# Patient Record
Sex: Female | Born: 1945 | Race: White | Hispanic: No | Smoking: Never smoker
Health system: Southern US, Community
[De-identification: ages and names within clinical notes are randomized; demographics above are authoritative.]

## PROBLEM LIST (undated history)

## (undated) DIAGNOSIS — M1990 Unspecified osteoarthritis, unspecified site: Secondary | ICD-10-CM

## (undated) DIAGNOSIS — T4145XA Adverse effect of unspecified anesthetic, initial encounter: Secondary | ICD-10-CM

## (undated) DIAGNOSIS — K219 Gastro-esophageal reflux disease without esophagitis: Secondary | ICD-10-CM

## (undated) DIAGNOSIS — R51 Headache: Secondary | ICD-10-CM

## (undated) DIAGNOSIS — H269 Unspecified cataract: Secondary | ICD-10-CM

## (undated) DIAGNOSIS — I10 Essential (primary) hypertension: Secondary | ICD-10-CM

## (undated) DIAGNOSIS — T8859XA Other complications of anesthesia, initial encounter: Secondary | ICD-10-CM

## (undated) DIAGNOSIS — Z8719 Personal history of other diseases of the digestive system: Secondary | ICD-10-CM

## (undated) DIAGNOSIS — I639 Cerebral infarction, unspecified: Secondary | ICD-10-CM

## (undated) DIAGNOSIS — Z8489 Family history of other specified conditions: Secondary | ICD-10-CM

## (undated) DIAGNOSIS — F419 Anxiety disorder, unspecified: Secondary | ICD-10-CM

## (undated) DIAGNOSIS — E785 Hyperlipidemia, unspecified: Secondary | ICD-10-CM

## (undated) HISTORY — PX: EYE SURGERY: SHX253

## (undated) HISTORY — PX: TONSILLECTOMY: SUR1361

## (undated) HISTORY — DX: Hyperlipidemia, unspecified: E78.5

---
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
## 1999-12-10 ENCOUNTER — Encounter: Admission: RE | Admit: 1999-12-10 | Discharge: 1999-12-10 | Payer: Self-pay | Admitting: Family Medicine

## 1999-12-10 ENCOUNTER — Encounter: Payer: Self-pay | Admitting: Family Medicine

## 2000-12-11 ENCOUNTER — Encounter: Admission: RE | Admit: 2000-12-11 | Discharge: 2000-12-11 | Payer: Self-pay | Admitting: Family Medicine

## 2000-12-11 ENCOUNTER — Encounter: Payer: Self-pay | Admitting: Family Medicine

## 2001-10-07 ENCOUNTER — Emergency Department (HOSPITAL_COMMUNITY): Admission: EM | Admit: 2001-10-07 | Discharge: 2001-10-07 | Payer: Self-pay | Admitting: Emergency Medicine

## 2001-10-07 ENCOUNTER — Encounter: Payer: Self-pay | Admitting: Emergency Medicine

## 2001-12-13 ENCOUNTER — Encounter: Admission: RE | Admit: 2001-12-13 | Discharge: 2001-12-13 | Payer: Self-pay | Admitting: Family Medicine

## 2001-12-13 ENCOUNTER — Encounter: Payer: Self-pay | Admitting: Family Medicine

## 2003-01-10 ENCOUNTER — Encounter: Payer: Self-pay | Admitting: Family Medicine

## 2003-01-10 ENCOUNTER — Encounter: Admission: RE | Admit: 2003-01-10 | Discharge: 2003-01-10 | Payer: Self-pay | Admitting: Family Medicine

## 2004-02-04 ENCOUNTER — Encounter: Admission: RE | Admit: 2004-02-04 | Discharge: 2004-02-04 | Payer: Self-pay | Admitting: Family Medicine

## 2005-02-23 ENCOUNTER — Encounter: Admission: RE | Admit: 2005-02-23 | Discharge: 2005-02-23 | Payer: Self-pay | Admitting: Family Medicine

## 2006-02-24 ENCOUNTER — Encounter: Admission: RE | Admit: 2006-02-24 | Discharge: 2006-02-24 | Payer: Self-pay | Admitting: Family Medicine

## 2007-02-28 ENCOUNTER — Encounter: Admission: RE | Admit: 2007-02-28 | Discharge: 2007-02-28 | Payer: Self-pay | Admitting: Family Medicine

## 2007-07-20 ENCOUNTER — Encounter: Admission: RE | Admit: 2007-07-20 | Discharge: 2007-07-20 | Payer: Self-pay | Admitting: Family Medicine

## 2011-09-09 ENCOUNTER — Other Ambulatory Visit: Payer: Self-pay | Admitting: Family Medicine

## 2011-09-14 ENCOUNTER — Ambulatory Visit
Admission: RE | Admit: 2011-09-14 | Discharge: 2011-09-14 | Disposition: A | Discharge status: Home / Self Care | Payer: No Typology Code available for payment source | Source: Ambulatory Visit | Attending: Family Medicine | Admitting: Family Medicine

## 2011-09-16 ENCOUNTER — Ambulatory Visit (HOSPITAL_COMMUNITY)
Admission: RE | Admit: 2011-09-16 | Discharge: 2011-09-16 | Disposition: A | Discharge status: Hospice | Payer: Self-pay | Source: Ambulatory Visit | Attending: Gastroenterology | Admitting: Gastroenterology

## 2011-09-16 ENCOUNTER — Encounter (HOSPITAL_COMMUNITY): Payer: Self-pay | Admitting: *Deleted

## 2011-09-16 ENCOUNTER — Encounter (HOSPITAL_COMMUNITY)
Admission: RE | Disposition: A | Discharge status: Hospice | Payer: Self-pay | Source: Ambulatory Visit | Attending: Gastroenterology

## 2011-09-16 DIAGNOSIS — D126 Benign neoplasm of colon, unspecified: Secondary | ICD-10-CM | POA: Insufficient documentation

## 2011-09-16 DIAGNOSIS — K449 Diaphragmatic hernia without obstruction or gangrene: Secondary | ICD-10-CM | POA: Insufficient documentation

## 2011-09-16 DIAGNOSIS — R112 Nausea with vomiting, unspecified: Secondary | ICD-10-CM | POA: Insufficient documentation

## 2011-09-16 DIAGNOSIS — K5730 Diverticulosis of large intestine without perforation or abscess without bleeding: Secondary | ICD-10-CM | POA: Insufficient documentation

## 2011-09-16 DIAGNOSIS — R634 Abnormal weight loss: Secondary | ICD-10-CM | POA: Insufficient documentation

## 2011-09-16 DIAGNOSIS — K921 Melena: Secondary | ICD-10-CM | POA: Insufficient documentation

## 2011-09-16 DIAGNOSIS — R109 Unspecified abdominal pain: Secondary | ICD-10-CM | POA: Insufficient documentation

## 2011-09-16 HISTORY — PX: ESOPHAGOGASTRODUODENOSCOPY: SHX5428

## 2011-09-16 HISTORY — DX: Unspecified osteoarthritis, unspecified site: M19.90

## 2011-09-16 HISTORY — DX: Anxiety disorder, unspecified: F41.9

## 2011-09-16 HISTORY — PX: COLONOSCOPY: SHX5424

## 2011-09-16 SURGERY — EGD (ESOPHAGOGASTRODUODENOSCOPY)
Anesthesia: Moderate Sedation

## 2011-09-16 MED ORDER — FENTANYL CITRATE 0.05 MG/ML IJ SOLN
INTRAMUSCULAR | Status: AC
  Filled 2011-09-16: qty 4

## 2011-09-16 MED ORDER — SODIUM CHLORIDE 0.9 % IV SOLN
Freq: Once | INTRAVENOUS | Status: AC
  Administered 2011-09-16: 500 mL via INTRAVENOUS

## 2011-09-16 MED ORDER — DIPHENHYDRAMINE HCL 50 MG/ML IJ SOLN
INTRAMUSCULAR | Status: AC
  Filled 2011-09-16: qty 1

## 2011-09-16 MED ORDER — MIDAZOLAM HCL 10 MG/2ML IJ SOLN
INTRAMUSCULAR | Status: AC
  Filled 2011-09-16: qty 4

## 2011-09-16 MED ORDER — SODIUM CHLORIDE 0.9 % IV SOLN: INTRAVENOUS | Status: DC

## 2011-09-16 MED ORDER — FENTANYL NICU IV SYRINGE 50 MCG/ML
INJECTION | INTRAMUSCULAR | Status: DC | PRN
  Administered 2011-09-16 (×4): 25 ug via INTRAVENOUS

## 2011-09-16 MED ORDER — MIDAZOLAM HCL 10 MG/2ML IJ SOLN
INTRAMUSCULAR | Status: DC | PRN
  Administered 2011-09-16: 2 mg via INTRAVENOUS
  Administered 2011-09-16: 1 mg via INTRAVENOUS
  Administered 2011-09-16 (×3): 2 mg via INTRAVENOUS

## 2011-09-16 NOTE — H&P (Signed)
  Reason for Consult: ABM pain, Melena Referring Physician: Marjory Lies, M.D.  Tilman Neat HPI: This is a 66 year old female with ABM pain and melena.  Her symptoms started 3 weeks ago and it is also associated with a 14 lbs weight loss.  Nausea and vomiting are symptoms and in the office she was identified to have an HGB of 9.  Past Medical History  Diagnosis Date  . Arthritis   . Anxiety     History reviewed. No pertinent past surgical history.  History reviewed. No pertinent family history.  Social History:  does not have a smoking history on file. She does not have any smokeless tobacco history on file. She reports that she does not drink alcohol or use illicit drugs.  Allergies:  Allergies  Allergen Reactions  . Sulfa Drugs Cross Reactors Nausea And Vomiting    Medications:  Scheduled:   . sodium chloride   Intravenous Once   Continuous:   No results found for this or any previous visit (from the past 24 hour(s)).   No results found.  ROS:  As stated above in the HPI otherwise negative.  Blood pressure 134/77, pulse 68, temperature 98.3 F (36.8 C), resp. rate 15, height 5\' 2"  (1.575 m), weight 58.968 kg (130 lb), SpO2 100.00%.    PE: Gen: NAD, Alert and Oriented HEENT:  [place]/AT, EOMI Neck: Supple, no LAD Lungs: CTA Bilaterally CV: RRR without M/G/R ABM: Soft, diffuse ABM pain, +BS Ext: No C/C/E  Assessment/Plan: 1) Heme positive stool. 2) ABM pain. 3) Melena.  Plan: 1) EGD/Colonoscopy.  , D 09/16/2011, 1:15 PM

## 2011-09-16 NOTE — Op Note (Signed)
Eligha Bridegroom [facility] [address] [place], [place]  [address]  OPERATIVE PROCEDURE REPORT  PATIENT:  Brittany Osborn, Brittany Osborn  MR#:  [id number] BIRTHDATE:  07-Sep-1945  GENDER:  female ENDOSCOPIST:  Jeani Hawking, MD ASSISTANT:  Kizzie Bane and Claudie Revering, RN CGRN PROCEDURE DATE:  09/16/2011 PROCEDURE:  Colonoscopy with snare polypectomy ASA CLASS:  Class II INDICATIONS:  1) Abdominal pain  2) heme positive stool MEDICATIONS:  Fentanyl 25 mcg IV, Versed 4 mg IV  DESCRIPTION OF PROCEDURE:   After the risks benefits and alternatives of the procedure were thoroughly explained, informed consent was obtained.  Digital rectal exam was performed and revealed no abnormalities.   The Pentax Colonoscope [id number] and [id number] [id number]) endoscope was introduced through the anus and advanced to the terminal ileum which was intubated for a short distance, without limitations.  The quality of the prep was excellent..  The instrument was then slowly withdrawn as the colon was fully examined. <<PROCEDUREIMAGES>>  FINDINGS:  A 3 mm sessile sigmoid colon polyp was removed with a cold snare. A couple of sigmoid diverticula were identified. No other abnormalities noted. The colon was difficult to traverse as her sigmoid colon was torturous and redundant.   Retroflexed views in the rectum revealed internal and external hemorrhoids.    The scope was then withdrawn from the patient and the procedure terminated.  COMPLICATIONS:  None  IMPRESSION:  1) Sessile polyp 2) Internal and external hemorrhoids 3) Diverticula RECOMMENDATIONS:  1) Await biopsy results 2) Repeat colonoscopy in 5-10 years.  ______________________________ Jeani Hawking, MD  n. Rosalie DoctorJeani Hawking at 09/16/2011 01:59 PM  Nehemiah Massed, [phone]

## 2011-09-16 NOTE — Discharge Instructions (Addendum)
Colonoscopy Care After Read the instructions outlined below and refer to this sheet in the next few weeks. These discharge instructions provide you with general information on caring for yourself after you leave the hospital. Your doctor may also give you specific instructions. While your treatment has been planned according to the most current medical practices available, unavoidable complications occasionally occur. If you have any problems or questions after discharge, call your doctor. HOME CARE INSTRUCTIONS ACTIVITY:  You may resume your regular activity, but move at a slower pace for the next 24 hours.   Take frequent rest periods for the next 24 hours.   Walking will help get rid of the air and reduce the bloated feeling in your belly (abdomen).   No driving for 24 hours (because of the medicine (anesthesia) used during the test).   You may shower.   Do not sign any important legal documents or operate any machinery for 24 hours (because of the anesthesia used during the test).  NUTRITION:  Drink plenty of fluids.   You may resume your normal diet as instructed by your doctor.   Begin with a light meal and progress to your normal diet. Heavy or fried foods are harder to digest and may make you feel sick to your stomach (nauseated).   Avoid alcoholic beverages for 24 hours or as instructed.  MEDICATIONS:  You may resume your normal medications unless your doctor tells you otherwise.  WHAT TO EXPECT TODAY:  Some feelings of bloating in the abdomen.   Passage of more gas than usual.   Spotting of blood in your stool or on the toilet paper.  IF YOU HAD POLYPS REMOVED DURING THE COLONOSCOPY:  No aspirin products for 7 days or as instructed.   No alcohol for 7 days or as instructed.   Eat a soft diet for the next 24 hours.  FINDING OUT THE RESULTS OF YOUR TEST Not all test results are available during your visit. If your test results are not back during the visit, make an  appointment with your caregiver to find out the results. Do not assume everything is normal if you have not heard from your caregiver or the medical facility. It is important for you to follow up on all of your test results.  SEEK IMMEDIATE MEDICAL CARE IF:  You have more than a spotting of blood in your stool.   Your belly is swollen (abdominal distention).   You are nauseated or vomiting.   You have a fever.   You have abdominal pain or discomfort that is severe or gets worse throughout the day.  Document Released: 12/29/2003 Document Revised: 05/05/2011 Document Reviewed: 12/27/2007 [facility] Patient Information 2012 [place], [place].Endoscopy Care After Please read the instructions outlined below and refer to this sheet in the next few weeks. These discharge instructions provide you with general information on caring for yourself after you leave the hospital. Your doctor may also give you specific instructions. While your treatment has been planned according to the most current medical practices available, unavoidable complications occasionally occur. If you have any problems or questions after discharge, please call your doctor. HOME CARE INSTRUCTIONS Activity You may resume your regular activity but move at a slower pace for the next 24 hours.  Take frequent rest periods for the next 24 hours.  Walking will help expel (get rid of) the air and reduce the bloated feeling in your abdomen.  No driving for 24 hours (because of the anesthesia (medicine) used during  the test).  You may shower.  Do not sign any important legal documents or operate any machinery for 24 hours (because of the anesthesia used during the test).  Nutrition Drink plenty of fluids.  You may resume your normal diet.  Begin with a light meal and progress to your normal diet.  Avoid alcoholic beverages for 24 hours or as instructed by your caregiver.  Medications You may resume your normal medications unless your  caregiver tells you otherwise. What you can expect today You may experience abdominal discomfort such as a feeling of fullness or "gas" pains.  You may experience a sore throat for 2 to 3 days. This is normal. Gargling with salt water may help this.  Follow-up Your doctor will discuss the results of your test with you. SEEK IMMEDIATE MEDICAL CARE IF: You have excessive nausea (feeling sick to your stomach) and/or vomiting.  You have severe abdominal pain and distention (swelling).  You have trouble swallowing.  You have a temperature over 100 F (37.8 C).  You have rectal bleeding or vomiting of blood.  Document Released: 12/29/2003 Document Revised: 05/05/2011 Document Reviewed: 07/11/2007 [facility] Patient Information 2012 [place], [place]. Endoscopy Care After Please read the instructions outlined below and refer to this sheet in the next few weeks. These discharge instructions provide you with general information on caring for yourself after you leave the hospital. Your doctor may also give you specific instructions. While your treatment has been planned according to the most current medical practices available, unavoidable complications occasionally occur. If you have any problems or questions after discharge, please call your doctor. HOME CARE INSTRUCTIONS Activity  You may resume your regular activity but move at a slower pace for the next 24 hours.   Take frequent rest periods for the next 24 hours.   Walking will help expel (get rid of) the air and reduce the bloated feeling in your abdomen.   No driving for 24 hours (because of the anesthesia (medicine) used during the test).   You may shower.   Do not sign any important legal documents or operate any machinery for 24 hours (because of the anesthesia used during the test).  Nutrition  Drink plenty of fluids.   You may resume your normal diet.   Begin with a light meal and progress to your normal diet.   Avoid alcoholic  beverages for 24 hours or as instructed by your caregiver.  Medications You may resume your normal medications unless your caregiver tells you otherwise. What you can expect today  You may experience abdominal discomfort such as a feeling of fullness or "gas" pains.   You may experience a sore throat for 2 to 3 days. This is normal. Gargling with salt water may help this.  Follow-up Your doctor will discuss the results of your test with you. SEEK IMMEDIATE MEDICAL CARE IF:  You have excessive nausea (feeling sick to your stomach) and/or vomiting.   You have severe abdominal pain and distention (swelling).   You have trouble swallowing.   You have a temperature over 100 F (37.8 C).   You have rectal bleeding or vomiting of blood.  Document Released: 12/29/2003 Document Revised: 05/05/2011 Document Reviewed: 07/11/2007 [facility] Patient Information 2012 [place], [place].Colonoscopy Care After Read the instructions outlined below and refer to this sheet in the next few weeks. These discharge instructions provide you with general information on caring for yourself after you leave the hospital. Your doctor may also give you specific instructions. While your  treatment has been planned according to the most current medical practices available, unavoidable complications occasionally occur. If you have any problems or questions after discharge, call your doctor. HOME CARE INSTRUCTIONS ACTIVITY:  You may resume your regular activity, but move at a slower pace for the next 24 hours.   Take frequent rest periods for the next 24 hours.   Walking will help get rid of the air and reduce the bloated feeling in your belly (abdomen).   No driving for 24 hours (because of the medicine (anesthesia) used during the test).   You may shower.   Do not sign any important legal documents or operate any machinery for 24 hours (because of the anesthesia used during the test).  NUTRITION:  Drink plenty  of fluids.   You may resume your normal diet as instructed by your doctor.   Begin with a light meal and progress to your normal diet. Heavy or fried foods are harder to digest and may make you feel sick to your stomach (nauseated).   Avoid alcoholic beverages for 24 hours or as instructed.  MEDICATIONS:  You may resume your normal medications unless your doctor tells you otherwise.  WHAT TO EXPECT TODAY:  Some feelings of bloating in the abdomen.   Passage of more gas than usual.   Spotting of blood in your stool or on the toilet paper.  IF YOU HAD POLYPS REMOVED DURING THE COLONOSCOPY:  No aspirin products for 7 days or as instructed.   No alcohol for 7 days or as instructed.   Eat a soft diet for the next 24 hours.  FINDING OUT THE RESULTS OF YOUR TEST Not all test results are available during your visit. If your test results are not back during the visit, make an appointment with your caregiver to find out the results. Do not assume everything is normal if you have not heard from your caregiver or the medical facility. It is important for you to follow up on all of your test results.  SEEK IMMEDIATE MEDICAL CARE IF:  You have more than a spotting of blood in your stool.   Your belly is swollen (abdominal distention).   You are nauseated or vomiting.   You have a fever.   You have abdominal pain or discomfort that is severe or gets worse throughout the day.  Document Released: 12/29/2003 Document Revised: 05/05/2011 Document Reviewed: 12/27/2007 [facility] Patient Information 2012 [place], [place].Endoscopy Care After Please read the instructions outlined below and refer to this sheet in the next few weeks. These discharge instructions provide you with general information on caring for yourself after you leave the hospital. Your doctor may also give you specific instructions. While your treatment has been planned according to the most current medical practices available,  unavoidable complications occasionally occur. If you have any problems or questions after discharge, please call your doctor. HOME CARE INSTRUCTIONS Activity You may resume your regular activity but move at a slower pace for the next 24 hours.  Take frequent rest periods for the next 24 hours.  Walking will help expel (get rid of) the air and reduce the bloated feeling in your abdomen.  No driving for 24 hours (because of the anesthesia (medicine) used during the test).  You may shower.  Do not sign any important legal documents or operate any machinery for 24 hours (because of the anesthesia used during the test).  Nutrition Drink plenty of fluids.  You may resume your normal diet.  Begin  with a light meal and progress to your normal diet.  Avoid alcoholic beverages for 24 hours or as instructed by your caregiver.  Medications You may resume your normal medications unless your caregiver tells you otherwise. What you can expect today You may experience abdominal discomfort such as a feeling of fullness or "gas" pains.  You may experience a sore throat for 2 to 3 days. This is normal. Gargling with salt water may help this.  Follow-up Your doctor will discuss the results of your test with you. SEEK IMMEDIATE MEDICAL CARE IF: You have excessive nausea (feeling sick to your stomach) and/or vomiting.  You have severe abdominal pain and distention (swelling).  You have trouble swallowing.  You have a temperature over 100 F (37.8 C).  You have rectal bleeding or vomiting of blood.  Document Released: 12/29/2003 Document Revised: 05/05/2011 Document Reviewed: 07/11/2007 [facility] Patient Information 2012 [place], [place].

## 2011-09-16 NOTE — Op Note (Signed)
Eligha Bridegroom [facility] [address] [place], [place]  [address]  OPERATIVE PROCEDURE REPORT  PATIENT:  Charlize, Hathaway  MR#:  [id number] BIRTHDATE:  Jun 10, 1945  GENDER:  female ENDOSCOPIST:  Jeani Hawking, MD ASSISTANT:  Kizzie Bane and Claudie Revering, RN CGRN PROCEDURE DATE:  09/16/2011 PROCEDURE:  EGD, diagnostic [phone] ASA CLASS:  Class II INDICATIONS:  Nausea/Vomiting and ABM pain MEDICATIONS:  Fentanyl 50 mcg IV, Versed 4 mg IV  DESCRIPTION OF PROCEDURE:   After the risks benefits and alternatives of the procedure were thoroughly explained, informed consent was obtained.  The Pentax Gastroscope [id number] endoscope was introduced through the mouth and advanced to the second portion of the duodenum, without limitations.  The instrument was slowly withdrawn as the mucosa was fully examined. <<PROCEDUREIMAGES>>  FINDINGS:  An LA Grade D distal esophagitis was identified in the setting of a large 8 cm hiatal hernia. Within the hernia sac, a large periesophageal hernia was identified. No evidence of any vascular compromise. No other abnormalities noted in the upper GI tract, i.e., ulcerations, erosions, or vascular abnormalities. Retroflexion was not performed.  The scope was then withdrawn from the patient and the procedure terminated.  COMPLICATIONS:  None  IMPRESSION:  1) LA Grade Dsophagitis. 2) 8 cm hiatal hernia. 3) Paraesophageal hiatal hernia. RECOMMENDATIONS:  1) Continue with Nexium. 2) CT scan of the ABM/Pelvis. 3) Surgical consultation if symptoms do not resolve with Nexium.   ______________________________ Jeani Hawking, MD  n. Rosalie DoctorJeani Hawking at 09/16/2011 02:04 PM  Nehemiah Massed, [phone]

## 2011-09-19 ENCOUNTER — Encounter (HOSPITAL_COMMUNITY): Payer: Self-pay | Admitting: Gastroenterology

## 2012-09-19 ENCOUNTER — Telehealth (INDEPENDENT_AMBULATORY_CARE_PROVIDER_SITE_OTHER): Payer: Self-pay

## 2012-09-19 NOTE — Telephone Encounter (Signed)
Patient states she is doing ok at this time with her hiatal hernia. Her insurance starts  may 1,2014. Appointment with Dr. Ezzard Standing was made for 10/04/12 @ 4p. Advised her to go to the ER.if symptoms worsens. Patient verbalizes understanding.

## 2012-10-04 ENCOUNTER — Other Ambulatory Visit (INDEPENDENT_AMBULATORY_CARE_PROVIDER_SITE_OTHER): Payer: Self-pay

## 2012-10-04 ENCOUNTER — Encounter (INDEPENDENT_AMBULATORY_CARE_PROVIDER_SITE_OTHER): Payer: Self-pay | Admitting: Surgery

## 2012-10-04 ENCOUNTER — Ambulatory Visit (INDEPENDENT_AMBULATORY_CARE_PROVIDER_SITE_OTHER): Payer: Medicare Other | Admitting: Surgery

## 2012-10-04 VITALS — BP 134/72 | HR 60 | Resp 14 | Ht 62.0 in | Wt 128.0 lb

## 2012-10-04 DIAGNOSIS — R109 Unspecified abdominal pain: Secondary | ICD-10-CM

## 2012-10-04 NOTE — Progress Notes (Addendum)
Re:   Brittany Osborn DOB:   03/06/1946 MRN:   [id number]  ASSESSMENT AND PLAN: 1.  Hiatal Hernia, approx 8 cm on upper endo.  Grade 4 esophagitis.  I have an upper endo by Dr. Elnoria Howard from April 2013.  I have no other radiologic tests.  I discussed the work up and management of a hiatal hernia and GERD.  She is dong a lot of things right.  We'll start with an UGI.  Depending on the findings, I talked about a CT scan.  Manometry most likely will not be necessary.  I gave her literature on medical and surgical management of GERD/HH.    I'll talk to her on the phone after the UGI and we'll decide on the next step.  [Spoke with Dr. Elnoria Howard.  He had not seen the patient in a year, but did reinforce that she had significant esophagitis. Dr. Jena Gauss spoke to me about the UGI and we discussed the size of the [facility] and how her stomach outlet was partially blocked. I spoke to the patient.  There is no reason for further diagnostic tests.  I think that she needs a hiatal hernia repair with probable Nissen.  Will schedule this.  I'll try to see her back in the office before surgery.  DN  10/10/2012]  1a.  Has LUQ/left flank pain   She's unsure whether it is due to the vomiting (musculoskeletal) or GI in nature.  2.  Persistent nausea and vomiting - almost daily x 2 years.  She says she has lost about 50 pounds since this all began, but she is holding her weight stable over the last 3 months.  3.  Arthritis - hands and legs 4.  Anxiety 5.  Bronchitis since she was a child  Coughs and tight chest, but since she has gotten off milk, she has gotten better.  No chief complaint on file.  REFERRING PHYSICIAN:   Dr. Chip Boer  HISTORY OF PRESENT ILLNESS: Brittany Osborn is a 67 y.o. (DOB: 04/02/1946)  white  female whose primary care physician is BURNETT,BRENT A, MD and comes to me today for hiatal hernia.  She dates her symptoms as going on for 2 years. She complained of almost daily vomiting. She would vomit 5-10  minutes after eating. She occasionally vomits at night, but not usually. She stopped taking milk and dairy products, and is taking almond milk and this has helped some. She has no burning when she eats her food, but she had burning when she vomits.  She has noticed no blood in her vomit.  She is eating a bland diet, staying away from fried and spicy food.  She is cooking with olive oil, which seems to help.   Dramamine seems to help her nausea and Benadryl seems to help her sleep.   She is on Ranitidine and Zegerid, but I'm a little unclear how much these are helping her.  She's had some trouble with constipation - metamucil sometimes helps.  Ms. Helman underwent an upper endo by Dr. Elnoria Howard on 09/16/2011 - he noted an 8 cm HH and grade 4 esophagitis.  She also underwent a colonoscopy 09/16/2011 which showed some diverticulosis.  He found a hyperplastic polyp.  She had a [place] on 09/09/2011 that was negative.  On a CXR in 2009, I can see what I think is the suggestion of a HH.  I do not have a report in Epic.  Part of the issues of her getting  care has been that she had no insurance.  She now has medicare.  She has no history of liver, gallbladder or GYN problems. She's had no prior abdominal surgery other than a C-section for her son.    Past Medical History  Diagnosis Date  . Arthritis   . Anxiety       Past Surgical History  Procedure Laterality Date  . Esophagogastroduodenoscopy  09/16/2011    Procedure: ESOPHAGOGASTRODUODENOSCOPY (EGD);  Surgeon: Theda Belfast, MD;  Location: [facility] ENDOSCOPY;  Service: Endoscopy;  Laterality: N/A;  Per Dr. Elnoria Howard, pt wants do procedures at [facility] instead of [facility]  . Colonoscopy  09/16/2011    Procedure: COLONOSCOPY;  Surgeon: Theda Belfast, MD;  Location: [facility] ENDOSCOPY;  Service: Endoscopy;  Laterality: N/A;      Current Outpatient Prescriptions  Medication Sig Dispense Refill  . DimenhyDRINATE (DRAMAMINE PO) Take by mouth.      Maxwell Caul Bicarbonate (ZEGERID  PO) Take by mouth.      . polyethylene glycol powder (GLYCOLAX/MIRALAX) powder Take 17 g by mouth once.      . ranitidine (ZANTAC) 150 MG capsule Take 150 mg by mouth 2 (two) times daily.       No current facility-administered medications for this visit.      Allergies  Allergen Reactions  . Sulfa Drugs Cross Reactors Nausea And Vomiting    REVIEW OF SYSTEMS: Skin:  No history of rash.  No history of abnormal moles. Infection:  No history of hepatitis or HIV.  No history of MRSA. Neurologic:  No history of stroke.  No history of seizure.  No history of headaches. Cardiac:  No history of hypertension. No history of heart disease.    No history of seeing a cardiologist. Pulmonary:  Bronchitis since a child.  Endocrine:  No diabetes. No thyroid disease. Gastrointestinal:  See HPI.  No history of liver disease.  No history of gall bladder disease.  No history of pancreas disease.  No history of colon disease. Urologic:  No history of kidney stones.  No history of bladder infections. Musculoskeletal: Osteoarthritis of hands and legs. Hematologic:  No bleeding disorder.  No history of anemia.  Not anticoagulated. Psycho-social:  The patient is oriented.   The patient has no obvious psychologic or social impairment to understanding our conversation and plan.  SOCIAL and FAMILY HISTORY: Married.  Husband Brittany Osborn  (DOB - 10/09/1939) (I did a bilateral inguinal hernia on him in 01/2011) Son also in room.  They also have a daguhter.  PHYSICAL EXAM: BP 134/72  Pulse 60  Resp 14  Ht 5\' 2"  (1.575 m)  Wt 128 lb (58.06 kg)  BMI 23.41 kg/m2  General: WN thin WF who is alert and generally healthy appearing.  HEENT: Normal. Pupils equal. Neck: Supple. No mass.  No thyroid mass. Lymph Nodes:  No supraclavicular or cervical nodes. Lungs: Clear to auscultation and symmetric breath sounds. Heart:  RRR. No murmur or rub.  Abdomen: Soft. No mass. No tenderness. No hernia. Normal bowel sounds.   Scar from C section.  Particular attention paid to her left flank, where I can not feel much. Rectal: Not done. Extremities:  Good strength and ROM  in upper and lower extremities. Neurologic:  Grossly intact to motor and sensory function. Psychiatric: Has normal mood and affect. Behavior is normal.   DATA REVIEWED: Epic and Dr. Haywood Pao notes.  Ovidio Kin, MD,  [facility] [address] [place]  [phone]  [place], [place]    [place] Phone:  [phone] FAX:  [phone]

## 2012-10-05 ENCOUNTER — Telehealth (INDEPENDENT_AMBULATORY_CARE_PROVIDER_SITE_OTHER): Payer: Self-pay

## 2012-10-05 NOTE — Telephone Encounter (Signed)
UGI scheduled for 10/09/12@ 11:00am at [facility] ; NPO after midnight. Patient aware

## 2012-10-09 ENCOUNTER — Ambulatory Visit (HOSPITAL_COMMUNITY)
Admission: RE | Admit: 2012-10-09 | Discharge: 2012-10-09 | Disposition: A | Discharge status: Home / Self Care | Payer: Medicare Other | Source: Ambulatory Visit | Attending: Surgery | Admitting: Surgery

## 2012-10-09 DIAGNOSIS — K219 Gastro-esophageal reflux disease without esophagitis: Secondary | ICD-10-CM | POA: Insufficient documentation

## 2012-10-09 DIAGNOSIS — R109 Unspecified abdominal pain: Secondary | ICD-10-CM

## 2012-10-09 DIAGNOSIS — R1110 Vomiting, unspecified: Secondary | ICD-10-CM | POA: Insufficient documentation

## 2012-10-09 DIAGNOSIS — K449 Diaphragmatic hernia without obstruction or gangrene: Secondary | ICD-10-CM | POA: Insufficient documentation

## 2012-10-10 ENCOUNTER — Other Ambulatory Visit (INDEPENDENT_AMBULATORY_CARE_PROVIDER_SITE_OTHER): Payer: Self-pay | Admitting: Surgery

## 2012-10-29 ENCOUNTER — Telehealth (INDEPENDENT_AMBULATORY_CARE_PROVIDER_SITE_OTHER): Payer: Self-pay

## 2012-10-29 NOTE — Telephone Encounter (Signed)
Informed patient DR. Ezzard Standing will be seeing her next week I will call to inform her of date and time  Per Dr. Ezzard Standing.  Dr. Ezzard Standing is L DOW June 9-13

## 2012-10-30 ENCOUNTER — Encounter (HOSPITAL_COMMUNITY): Payer: Self-pay | Admitting: Pharmacy Technician

## 2012-11-07 NOTE — Patient Instructions (Signed)
Brittany Osborn  11/07/2012   Your procedure is scheduled on:  11/12/12    Report to [facility] at   0815 AM.  Call this number if you have problems the morning of surgery: [phone]   Remember:   Do not eat food or drink liquids after midnight.   Take these medicines the morning of surgery with A SIP OF WATER:    Do not wear jewelry, make-up or nail polish.  Do not wear lotions, powders, or perfumes.   Do not shave 48 hours prior to surgery.   Do not bring valuables to the hospital.  Contacts, dentures or bridgework may not be worn into surgery.  Leave suitcase in the car. After surgery it may be brought to your room.  For patients admitted to the hospital, checkout time is 11:00 AM the day of  discharge.       SEE CHG INSTRUCTION SHEET    Please read over the following fact sheets that you were given: MRSA Information, coughing and deep breathing exercises, leg exercises               Failure to comply with these instructions may result in cancellation of your surgery.                Patient Signature ____________________________              Nurse Signature _____________________________

## 2012-11-08 ENCOUNTER — Other Ambulatory Visit (INDEPENDENT_AMBULATORY_CARE_PROVIDER_SITE_OTHER): Payer: Self-pay | Admitting: Surgery

## 2012-11-08 ENCOUNTER — Encounter (HOSPITAL_COMMUNITY): Payer: Self-pay

## 2012-11-08 ENCOUNTER — Encounter (HOSPITAL_COMMUNITY)
Admission: RE | Admit: 2012-11-08 | Discharge: 2012-11-08 | Disposition: A | Discharge status: Home / Self Care | Payer: Medicare Other | Source: Ambulatory Visit | Attending: Surgery | Admitting: Surgery

## 2012-11-08 DIAGNOSIS — Z01812 Encounter for preprocedural laboratory examination: Secondary | ICD-10-CM | POA: Insufficient documentation

## 2012-11-08 DIAGNOSIS — K209 Esophagitis, unspecified without bleeding: Secondary | ICD-10-CM | POA: Insufficient documentation

## 2012-11-08 DIAGNOSIS — K449 Diaphragmatic hernia without obstruction or gangrene: Secondary | ICD-10-CM | POA: Insufficient documentation

## 2012-11-08 HISTORY — DX: Unspecified cataract: H26.9

## 2012-11-08 HISTORY — DX: Headache: R51

## 2012-11-08 HISTORY — DX: Gastro-esophageal reflux disease without esophagitis: K21.9

## 2012-11-08 HISTORY — DX: Personal history of other diseases of the digestive system: Z87.19

## 2012-11-08 LAB — SURGICAL PCR SCREEN: MRSA, PCR: NEGATIVE

## 2012-11-08 LAB — BASIC METABOLIC PANEL
CO2: 27 mEq/L (ref 19–32)
Calcium: 9.3 mg/dL (ref 8.4–10.5)
Creatinine, Ser: 0.65 mg/dL (ref 0.50–1.10)
GFR calc Af Amer: >90 mL/min (ref >90)
Sodium: 138 mEq/L (ref 135–145)

## 2012-11-08 LAB — CBC
MCH: 21.8 pg — ABNORMAL LOW (ref 26.0–34.0)
MCV: 74.6 fL — ABNORMAL LOW (ref 78.0–100.0)
Platelets: 280 10*3/uL (ref 150–400)
RBC: 4.22 MIL/uL (ref 3.87–5.11)
RDW: 17.2 % — ABNORMAL HIGH (ref 11.5–15.5)
WBC: 2.4 10*3/uL — ABNORMAL LOW (ref 4.0–10.5)

## 2012-11-08 NOTE — Progress Notes (Signed)
Reported CBC result to Dr Lucianne Muss office.  The nurse will notify  MD.

## 2012-11-08 NOTE — Progress Notes (Signed)
CBC faxed via EPIC to Dr Raelyn Mora.

## 2012-11-12 ENCOUNTER — Inpatient Hospital Stay (HOSPITAL_COMMUNITY)
Admission: RE | Admit: 2012-11-12 | Discharge: 2012-11-14 | DRG: 328 | Disposition: A | Discharge status: Home / Self Care | Payer: Medicare Other | Source: Ambulatory Visit | Attending: Surgery | Admitting: Surgery

## 2012-11-12 ENCOUNTER — Encounter (HOSPITAL_COMMUNITY): Payer: Self-pay | Admitting: Anesthesiology

## 2012-11-12 ENCOUNTER — Encounter (HOSPITAL_COMMUNITY): Payer: Self-pay | Admitting: *Deleted

## 2012-11-12 ENCOUNTER — Inpatient Hospital Stay (HOSPITAL_COMMUNITY): Payer: Medicare Other | Admitting: Anesthesiology

## 2012-11-12 ENCOUNTER — Encounter (HOSPITAL_COMMUNITY)
Admission: RE | Disposition: A | Discharge status: Home / Self Care | Payer: Self-pay | Source: Ambulatory Visit | Attending: Surgery

## 2012-11-12 DIAGNOSIS — J40 Bronchitis, not specified as acute or chronic: Secondary | ICD-10-CM | POA: Diagnosis present

## 2012-11-12 DIAGNOSIS — K449 Diaphragmatic hernia without obstruction or gangrene: Secondary | ICD-10-CM

## 2012-11-12 DIAGNOSIS — Z8719 Personal history of other diseases of the digestive system: Secondary | ICD-10-CM

## 2012-11-12 DIAGNOSIS — Z79899 Other long term (current) drug therapy: Secondary | ICD-10-CM

## 2012-11-12 DIAGNOSIS — F411 Generalized anxiety disorder: Secondary | ICD-10-CM | POA: Diagnosis present

## 2012-11-12 DIAGNOSIS — K209 Esophagitis, unspecified without bleeding: Secondary | ICD-10-CM | POA: Diagnosis present

## 2012-11-12 DIAGNOSIS — K219 Gastro-esophageal reflux disease without esophagitis: Secondary | ICD-10-CM | POA: Diagnosis present

## 2012-11-12 HISTORY — PX: LAPAROSCOPIC NISSEN FUNDOPLICATION: SHX1932

## 2012-11-12 SURGERY — FUNDOPLICATION, NISSEN, LAPAROSCOPIC
Anesthesia: General | Site: Abdomen | Wound class: Clean Contaminated

## 2012-11-12 MED ORDER — BIOTENE DRY MOUTH MT LIQD
15.0000 mL | Freq: Two times a day (BID) | OROMUCOSAL | Status: DC
  Administered 2012-11-13: 15 mL via OROMUCOSAL

## 2012-11-12 MED ORDER — LACTATED RINGERS IV SOLN: INTRAVENOUS | Status: DC

## 2012-11-12 MED ORDER — SUCCINYLCHOLINE CHLORIDE 20 MG/ML IJ SOLN
INTRAMUSCULAR | Status: DC | PRN
  Administered 2012-11-12: 100 mg via INTRAVENOUS

## 2012-11-12 MED ORDER — LACTATED RINGERS IV SOLN
INTRAVENOUS | Status: DC
  Administered 2012-11-12: 1000 mL via INTRAVENOUS
  Administered 2012-11-12: 11:00:00 via INTRAVENOUS

## 2012-11-12 MED ORDER — POTASSIUM CHLORIDE IN NACL 20-0.45 MEQ/L-% IV SOLN
INTRAVENOUS | Status: DC
  Administered 2012-11-12 – 2012-11-14 (×5): via INTRAVENOUS
  Filled 2012-11-12 (×6): qty 1000

## 2012-11-12 MED ORDER — ONDANSETRON HCL 4 MG PO TABS: 4.0000 mg | ORAL_TABLET | Freq: Four times a day (QID) | ORAL | Status: DC | PRN

## 2012-11-12 MED ORDER — CHLORHEXIDINE GLUCONATE 0.12 % MT SOLN
15.0000 mL | Freq: Two times a day (BID) | OROMUCOSAL | Status: DC
  Administered 2012-11-13: 15 mL via OROMUCOSAL
  Filled 2012-11-12 (×3): qty 15

## 2012-11-12 MED ORDER — PROMETHAZINE HCL 25 MG/ML IJ SOLN: 6.2500 mg | INTRAMUSCULAR | Status: DC | PRN

## 2012-11-12 MED ORDER — HEPARIN SODIUM (PORCINE) 5000 UNIT/ML IJ SOLN
5000.0000 [IU] | Freq: Three times a day (TID) | INTRAMUSCULAR | Status: DC
  Filled 2012-11-12 (×2): qty 1

## 2012-11-12 MED ORDER — PROPOFOL 10 MG/ML IV BOLUS
INTRAVENOUS | Status: DC | PRN
  Administered 2012-11-12: 80 ug via INTRAVENOUS
  Administered 2012-11-12: 120 ug via INTRAVENOUS

## 2012-11-12 MED ORDER — EPHEDRINE SULFATE 50 MG/ML IJ SOLN
INTRAMUSCULAR | Status: DC | PRN
  Administered 2012-11-12: 5 mg via INTRAVENOUS

## 2012-11-12 MED ORDER — MIDAZOLAM HCL 5 MG/5ML IJ SOLN
INTRAMUSCULAR | Status: DC | PRN
  Administered 2012-11-12: 2 mg via INTRAVENOUS

## 2012-11-12 MED ORDER — GLYCOPYRROLATE 0.2 MG/ML IJ SOLN
INTRAMUSCULAR | Status: DC | PRN
  Administered 2012-11-12: .4 mg via INTRAVENOUS

## 2012-11-12 MED ORDER — MORPHINE SULFATE 2 MG/ML IJ SOLN
INTRAMUSCULAR | Status: AC
  Administered 2012-11-12: 1 mg via INTRAVENOUS
  Filled 2012-11-12: qty 1

## 2012-11-12 MED ORDER — MORPHINE SULFATE 2 MG/ML IJ SOLN
INTRAMUSCULAR | Status: AC
  Administered 2012-11-12: 2 mg via INTRAVENOUS
  Filled 2012-11-12: qty 1

## 2012-11-12 MED ORDER — PROMETHAZINE HCL 25 MG/ML IJ SOLN: 12.5000 mg | Freq: Four times a day (QID) | INTRAMUSCULAR | Status: DC | PRN

## 2012-11-12 MED ORDER — FENTANYL CITRATE 0.05 MG/ML IJ SOLN
INTRAMUSCULAR | Status: DC | PRN
  Administered 2012-11-12 (×2): 50 ug via INTRAVENOUS
  Administered 2012-11-12: 100 ug via INTRAVENOUS

## 2012-11-12 MED ORDER — ONDANSETRON HCL 4 MG/2ML IJ SOLN: 4.0000 mg | Freq: Four times a day (QID) | INTRAMUSCULAR | Status: DC | PRN

## 2012-11-12 MED ORDER — LIDOCAINE HCL 1 % IJ SOLN
INTRAMUSCULAR | Status: DC | PRN
  Administered 2012-11-12: 80 mg via INTRADERMAL

## 2012-11-12 MED ORDER — MEPERIDINE HCL 50 MG/ML IJ SOLN: 6.2500 mg | INTRAMUSCULAR | Status: DC | PRN

## 2012-11-12 MED ORDER — CHLORHEXIDINE GLUCONATE 4 % EX LIQD: 1.0000 "application " | Freq: Once | CUTANEOUS | Status: DC

## 2012-11-12 MED ORDER — CISATRACURIUM BESYLATE (PF) 10 MG/5ML IV SOLN
INTRAVENOUS | Status: DC | PRN
  Administered 2012-11-12: 6 mg via INTRAVENOUS
  Administered 2012-11-12: 4 mg via INTRAVENOUS

## 2012-11-12 MED ORDER — HEPARIN SODIUM (PORCINE) 5000 UNIT/ML IJ SOLN
5000.0000 [IU] | Freq: Three times a day (TID) | INTRAMUSCULAR | Status: DC
  Administered 2012-11-12 – 2012-11-14 (×5): 5000 [IU] via SUBCUTANEOUS
  Filled 2012-11-12 (×8): qty 1

## 2012-11-12 MED ORDER — LACTATED RINGERS IV SOLN
INTRAVENOUS | Status: DC | PRN
  Administered 2012-11-12: 1000 mL via INTRAVENOUS

## 2012-11-12 MED ORDER — NEOSTIGMINE METHYLSULFATE 1 MG/ML IJ SOLN
INTRAMUSCULAR | Status: DC | PRN
  Administered 2012-11-12: 4 mg via INTRAVENOUS

## 2012-11-12 MED ORDER — ONDANSETRON HCL 4 MG/2ML IJ SOLN
INTRAMUSCULAR | Status: DC | PRN
  Administered 2012-11-12: 4 mg via INTRAVENOUS

## 2012-11-12 MED ORDER — PANTOPRAZOLE SODIUM 40 MG IV SOLR
40.0000 mg | Freq: Two times a day (BID) | INTRAVENOUS | Status: DC
  Administered 2012-11-12 – 2012-11-13 (×3): 40 mg via INTRAVENOUS
  Filled 2012-11-12 (×5): qty 40

## 2012-11-12 MED ORDER — MORPHINE SULFATE 10 MG/ML IJ SOLN
1.0000 mg | INTRAMUSCULAR | Status: DC | PRN
  Administered 2012-11-12: 1 mg via INTRAVENOUS
  Filled 2012-11-12: qty 1

## 2012-11-12 MED ORDER — BUPIVACAINE HCL (PF) 0.25 % IJ SOLN
INTRAMUSCULAR | Status: DC | PRN
  Administered 2012-11-12: 29 mL

## 2012-11-12 MED ORDER — FENTANYL CITRATE 0.05 MG/ML IJ SOLN
25.0000 ug | INTRAMUSCULAR | Status: DC | PRN
  Administered 2012-11-12 (×2): 25 ug via INTRAVENOUS
  Administered 2012-11-12: 50 ug via INTRAVENOUS

## 2012-11-12 MED ORDER — CEFAZOLIN SODIUM-DEXTROSE 2-3 GM-% IV SOLR
2.0000 g | INTRAVENOUS | Status: AC
  Administered 2012-11-12: 2 g via INTRAVENOUS

## 2012-11-12 SURGICAL SUPPLY — 51 items
APPLIER CLIP ROT 10 11.4 M/L (STAPLE) ×2
BENZOIN TINCTURE PRP APPL 2/3 (GAUZE/BANDAGES/DRESSINGS) IMPLANT
CANISTER SUCTION 2500CC (MISCELLANEOUS) IMPLANT
CANNULA ENDOPATH XCEL 11M (ENDOMECHANICALS) IMPLANT
CLAMP ENDO BABCK 10MM (STAPLE) IMPLANT
CLIP APPLIE ROT 10 11.4 M/L (STAPLE) ×1 IMPLANT
CLOTH BEACON ORANGE TIMEOUT ST (SAFETY) ×2 IMPLANT
DECANTER SPIKE VIAL GLASS SM (MISCELLANEOUS) ×2 IMPLANT
DERMABOND ADVANCED (GAUZE/BANDAGES/DRESSINGS) ×1
DERMABOND ADVANCED .7 DNX12 (GAUZE/BANDAGES/DRESSINGS) ×1 IMPLANT
DEVICE SUT QUICK LOAD TK 5 (STAPLE) ×14 IMPLANT
DEVICE SUT TI-KNOT TK 5X26 (MISCELLANEOUS) ×2 IMPLANT
DEVICE SUTURE ENDOST 10MM (ENDOMECHANICALS) ×2 IMPLANT
DISSECTOR BLUNT TIP ENDO 5MM (MISCELLANEOUS) IMPLANT
DRAIN PENROSE 18X1/2 LTX STRL (DRAIN) ×2 IMPLANT
DRAPE LAPAROSCOPIC ABDOMINAL (DRAPES) ×2 IMPLANT
ELECT REM PT RETURN 9FT ADLT (ELECTROSURGICAL) ×2
ELECTRODE REM PT RTRN 9FT ADLT (ELECTROSURGICAL) ×1 IMPLANT
FELT TEFLON 4 X1 (Mesh General) ×2 IMPLANT
GLOVE BIOGEL PI IND STRL 7.0 (GLOVE) ×1 IMPLANT
GLOVE BIOGEL PI INDICATOR 7.0 (GLOVE) ×1
GLOVE SURG SIGNA 7.5 PF LTX (GLOVE) ×2 IMPLANT
GOWN STRL NON-REIN LRG LVL3 (GOWN DISPOSABLE) IMPLANT
GOWN STRL REIN XL XLG (GOWN DISPOSABLE) ×12 IMPLANT
GRASPER ENDO BABCOCK 10 (MISCELLANEOUS) IMPLANT
GRASPER ENDO BABCOCK 10MM (MISCELLANEOUS)
KIT BASIN OR (CUSTOM PROCEDURE TRAY) ×2 IMPLANT
NS IRRIG 1000ML POUR BTL (IV SOLUTION) ×2 IMPLANT
PENCIL BUTTON HOLSTER BLD 10FT (ELECTRODE) IMPLANT
RETRACTOR LAPSCP 12X46 CVD (ENDOMECHANICALS) IMPLANT
RTRCTR LAPSCP 12X46 CVD (ENDOMECHANICALS)
SCALPEL HARMONIC ACE (MISCELLANEOUS) ×2 IMPLANT
SET IRRIG TUBING LAPAROSCOPIC (IRRIGATION / IRRIGATOR) ×2 IMPLANT
SLEEVE XCEL OPT CAN 5 100 (ENDOMECHANICALS) ×6 IMPLANT
SOLUTION ANTI FOG 6CC (MISCELLANEOUS) ×2 IMPLANT
STAPLER VISISTAT 35W (STAPLE) IMPLANT
STRIP CLOSURE SKIN 1/2X4 (GAUZE/BANDAGES/DRESSINGS) IMPLANT
SUT SURGIDAC NAB ES-9 0 48 120 (SUTURE) ×14 IMPLANT
SUT VIC AB 4-0 SH 18 (SUTURE) IMPLANT
SUT VICRYL 0 TIES 12 18 (SUTURE) IMPLANT
TIP INNERVISION DETACH 40FR (MISCELLANEOUS) IMPLANT
TIP INNERVISION DETACH 50FR (MISCELLANEOUS) IMPLANT
TIP INNERVISION DETACH 56FR (MISCELLANEOUS) ×2 IMPLANT
TIPS INNERVISION DETACH 40FR (MISCELLANEOUS)
TOWEL OR 17X26 10 PK STRL BLUE (TOWEL DISPOSABLE) ×2 IMPLANT
TRAY FOLEY CATH 14FRSI W/METER (CATHETERS) ×2 IMPLANT
TRAY LAP CHOLE (CUSTOM PROCEDURE TRAY) ×2 IMPLANT
TROCAR BLADELESS OPT 5 75 (ENDOMECHANICALS) ×2 IMPLANT
TROCAR XCEL 12X100 BLDLESS (ENDOMECHANICALS) IMPLANT
TROCAR XCEL NON-BLD 11X100MML (ENDOMECHANICALS) ×2 IMPLANT
TUBING INSUFFLATION 10FT LAP (TUBING) ×2 IMPLANT

## 2012-11-12 NOTE — H&P (Signed)
Re: Brittany Osborn  DOB: Aug 16, 1945  MRN: [id number]   ASSESSMENT AND PLAN:  1. Hiatal Hernia, approx 8 cm on upper endo.   Grade 4 esophagitis.   I have an upper endo by Dr. Elnoria Howard from April 2013. I have no other radiologic tests.   I discussed the work up and management of a hiatal hernia and GERD. She is dong a lot of things right. We'll start with an UGI. Depending on the findings, I talked about a CT scan. Manometry most likely will not be necessary. I gave her literature on medical and surgical management of GERD/HH.   I'll talk to her on the phone after the UGI and we'll decide on the next step.  [Spoke with Dr. Elnoria Howard. He had not seen the patient in a year, but did reinforce that she had significant esophagitis.  Dr. Jena Gauss spoke to me about the UGI and we discussed the size of the [facility] and how her stomach outlet was partially blocked.  I spoke to the patient. There is no reason for further diagnostic tests. I think that she needs a hiatal hernia repair with probable Nissen. Will schedule this. I'll try to see her back in the office before surgery. DN 10/10/2012]   1a. Has LUQ/left flank pain   She's unsure whether it is due to the vomiting (musculoskeletal) or GI in nature.  2. Persistent nausea and vomiting - almost daily x 2 years.  She says she has lost about 50 pounds since this all began, but she is holding her weight stable over the last 3 months.  3. Arthritis - hands and legs  4. Anxiety  5. Bronchitis since she was a child   Coughs and tight chest, but since she has gotten off milk, she has gotten better.   No chief complaint on file.   REFERRING PHYSICIAN: Dr. Chip Boer   HISTORY OF PRESENT ILLNESS:  Brittany Osborn is a 67 y.o. (DOB: 1946-05-04) white female whose primary care physician is BURNETT,BRENT A, MD and comes to me today for hiatal hernia.  She dates her symptoms as going on for 2 years. She complained of almost daily vomiting. She would vomit 5-10 minutes after eating. She  occasionally vomits at night, but not usually. She stopped taking milk and dairy products, and is taking almond milk and this has helped some. She has no burning when she eats her food, but she had burning when she vomits. She has noticed no blood in her vomit. She is eating a bland diet, staying away from fried and spicy food. She is cooking with olive oil, which seems to help. Dramamine seems to help her nausea and Benadryl seems to help her sleep. She is on Ranitidine and Zegerid, but I'm a little unclear how much these are helping her. She's had some trouble with constipation - metamucil sometimes helps.   Ms. Thurlow underwent an upper endo by Dr. Elnoria Howard on 09/16/2011 - he noted an 8 cm HH and grade 4 esophagitis. She also underwent a colonoscopy 09/16/2011 which showed some diverticulosis. He found a hyperplastic polyp. She had a [place] on 09/09/2011 that was negative. On a CXR in 2009, I can see what I think is the suggestion of a HH. I do not have a report in Epic.  Part of the issues of her getting care has been that she had no insurance. She now has medicare.  She has no history of liver, gallbladder or GYN problems. She's had no  prior abdominal surgery other than a C-section for her son.   Past Medical History   Diagnosis  Date   .  Arthritis    .  Anxiety     Past Surgical History   Procedure  Laterality  Date   .  Esophagogastroduodenoscopy   09/16/2011     Procedure: ESOPHAGOGASTRODUODENOSCOPY (EGD); Surgeon: Theda Belfast, MD; Location: [facility] ENDOSCOPY; Service: Endoscopy; Laterality: N/A; Per Dr. Elnoria Howard, pt wants do procedures at [facility] instead of [facility]   .  Colonoscopy   09/16/2011     Procedure: COLONOSCOPY; Surgeon: Theda Belfast, MD; Location: [facility] ENDOSCOPY; Service: Endoscopy; Laterality: N/A;    Current Outpatient Prescriptions   Medication  Sig  Dispense  Refill   .  DimenhyDRINATE (DRAMAMINE PO)  Take by mouth.     Maxwell Caul Bicarbonate (ZEGERID PO)  Take by mouth.     .   polyethylene glycol powder (GLYCOLAX/MIRALAX) powder  Take 17 g by mouth once.     .  ranitidine (ZANTAC) 150 MG capsule  Take 150 mg by mouth 2 (two) times daily.      No current facility-administered medications for this visit.    Allergies   Allergen  Reactions   .  Sulfa Drugs Cross Reactors  Nausea And Vomiting    REVIEW OF SYSTEMS:  Skin: No history of rash. No history of abnormal moles.  Infection: No history of hepatitis or HIV. No history of MRSA.  Neurologic: No history of stroke. No history of seizure. No history of headaches.  Cardiac: No history of hypertension. No history of heart disease. No history of seeing a cardiologist.  Pulmonary: Bronchitis since a child.  Endocrine: No diabetes. No thyroid disease.  Gastrointestinal: See HPI. No history of liver disease. No history of gall bladder disease. No history of pancreas disease. No history of colon disease.  Urologic: No history of kidney stones. No history of bladder infections.  Musculoskeletal: Osteoarthritis of hands and legs.  Hematologic: No bleeding disorder. No history of anemia. Not anticoagulated.  Psycho-social: The patient is oriented. The patient has no obvious psychologic or social impairment to understanding our conversation and plan.   SOCIAL and FAMILY HISTORY:  Married. Husband Marvalene Barrett (DOB - 10/09/1939) (I did a bilateral inguinal hernia on him in 01/2011)  Son also in room. They also have a daguhter.   PHYSICAL EXAM:  BP 124/53  Pulse 63  Temp(Src) 97.8 F (36.6 C) (Oral)  Resp 18  SpO2 100%  General: WN thin WF who is alert and generally healthy appearing.  HEENT: Normal. Pupils equal.  Neck: Supple. No mass. No thyroid mass.  Lymph Nodes: No supraclavicular or cervical nodes.  Lungs: Clear to auscultation and symmetric breath sounds.  Heart: RRR. No murmur or rub.  Abdomen: Soft. No mass. No tenderness. No hernia. Normal bowel sounds. Scar from C section. Particular attention paid to her  left flank, where I can not feel much.  Rectal: Not done.  Extremities: Good strength and ROM in upper and lower extremities.  Neurologic: Grossly intact to motor and sensory function.  Psychiatric: Has normal mood and affect. Behavior is normal.   DATA REVIEWED:  Epic and Dr. Haywood Pao notes.   Ovidio Kin, MD, [facility]  [address] [place]  [place], [place] [address]  Phone: [phone] FAX: [phone]

## 2012-11-12 NOTE — Anesthesia Procedure Notes (Signed)
Procedure Name: Intubation Date/Time: 11/12/2012 10:15 AM Performed by: Hulan Fess Pre-anesthesia Checklist: Patient identified, Emergency Drugs available, Suction available and Timeout performed Patient Re-evaluated:Patient Re-evaluated prior to inductionOxygen Delivery Method: Circle system utilized Preoxygenation: Pre-oxygenation with 100% oxygen Intubation Type: IV induction Laryngoscope Size: Mac and 3 Grade View: Grade I Tube type: Oral Tube size: 8.0 mm Number of attempts: 1 Placement Confirmation: ETT inserted through vocal cords under direct vision Secured at: 22 cm Tube secured with: Tape Dental Injury: Teeth and Oropharynx as per pre-operative assessment

## 2012-11-12 NOTE — Anesthesia Postprocedure Evaluation (Signed)
  Anesthesia Post-op Note  Patient: Brittany Osborn  Procedure(s) Performed: Procedure(s) (LRB): LAPAROSCOPIC NISSEN FUNDOPLICATION laparoscopic hiatal hernia repair   (N/A)  Patient Location: PACU  Anesthesia Type: General  Level of Consciousness: awake and alert   Airway and Oxygen Therapy: Patient Spontanous Breathing  Post-op Pain: mild  Post-op Assessment: Post-op Vital signs reviewed, Patient's Cardiovascular Status Stable, Respiratory Function Stable, Patent Airway and No signs of Nausea or vomiting  Last Vitals:  Filed Vitals:   11/12/12 1430  BP: 157/77  Pulse: 58  Temp:   Resp: 18    Post-op Vital Signs: stable   Complications: No apparent anesthesia complications

## 2012-11-12 NOTE — Transfer of Care (Signed)
Immediate Anesthesia Transfer of Care Note  Patient: Brittany Osborn  Procedure(s) Performed: Procedure(s): LAPAROSCOPIC NISSEN FUNDOPLICATION laparoscopic hiatal hernia repair   (N/A)  Patient Location: PACU  Anesthesia Type:General  Level of Consciousness: awake and alert   Airway & Oxygen Therapy: Patient Spontanous Breathing and Patient connected to face mask oxygen  Post-op Assessment: Report given to PACU RN  Post vital signs: Reviewed and stable  Complications: No apparent anesthesia complications

## 2012-11-12 NOTE — Anesthesia Preprocedure Evaluation (Signed)
Anesthesia Evaluation  Patient identified by MRN, date of birth, ID band Patient awake    Reviewed: Allergy & Precautions, H&P , NPO status , Patient's Chart, lab work & pertinent test results  Airway Mallampati: II TM Distance: >3 FB Neck ROM: Full    Dental no notable dental hx.    Pulmonary neg pulmonary ROS,  breath sounds clear to auscultation  Pulmonary exam normal       Cardiovascular negative cardio ROS  Rhythm:Regular Rate:Normal     Neuro/Psych negative neurological ROS  negative psych ROS   GI/Hepatic negative GI ROS, Neg liver ROS, hiatal hernia, GERD-  Poorly Controlled,  Endo/Other  negative endocrine ROS  Renal/GU negative Renal ROS  negative genitourinary   Musculoskeletal negative musculoskeletal ROS (+)   Abdominal   Peds negative pediatric ROS (+)  Hematology negative hematology ROS (+)   Anesthesia Other Findings   Reproductive/Obstetrics negative OB ROS                           Anesthesia Physical Anesthesia Plan  ASA: I  Anesthesia Plan: General   Post-op Pain Management:    Induction: Intravenous, Rapid sequence and Cricoid pressure planned  Airway Management Planned: Oral ETT  Additional Equipment:   Intra-op Plan:   Post-operative Plan: Extubation in OR  Informed Consent: I have reviewed the patients History and Physical, chart, labs and discussed the procedure including the risks, benefits and alternatives for the proposed anesthesia with the patient or authorized representative who has indicated his/her understanding and acceptance.   Dental advisory given  Plan Discussed with: CRNA  Anesthesia Plan Comments:         Anesthesia Quick Evaluation

## 2012-11-13 ENCOUNTER — Encounter (HOSPITAL_COMMUNITY): Payer: Self-pay | Admitting: Surgery

## 2012-11-13 ENCOUNTER — Inpatient Hospital Stay (HOSPITAL_COMMUNITY): Payer: Medicare Other

## 2012-11-13 MED ORDER — ADULT MULTIVITAMIN W/MINERALS CH
1.0000 | ORAL_TABLET | Freq: Every day | ORAL | Status: DC
  Administered 2012-11-13 – 2012-11-14 (×2): 1 via ORAL
  Filled 2012-11-13 (×2): qty 1

## 2012-11-13 MED ORDER — MORPHINE SULFATE 2 MG/ML IJ SOLN
INTRAMUSCULAR | Status: AC
  Administered 2012-11-13: 1 mg via INTRAVENOUS
  Filled 2012-11-13: qty 1

## 2012-11-13 MED ORDER — OXYCODONE-ACETAMINOPHEN 5-325 MG/5ML PO SOLN
5.0000 mL | ORAL | Status: DC | PRN
  Administered 2012-11-13: 5 mL via ORAL
  Filled 2012-11-13: qty 5

## 2012-11-13 MED ORDER — BOOST / RESOURCE BREEZE PO LIQD
1.0000 | Freq: Two times a day (BID) | ORAL | Status: DC
  Administered 2012-11-14: 1 via ORAL

## 2012-11-13 MED ORDER — RESOURCE INSTANT PROTEIN PO PWD PACKET
1.0000 | Freq: Three times a day (TID) | ORAL | Status: DC
  Administered 2012-11-13 – 2012-11-14 (×2): 6 g via ORAL
  Filled 2012-11-13: qty 227
  Filled 2012-11-13: qty 6

## 2012-11-13 MED ORDER — IOHEXOL 300 MG/ML  SOLN
150.0000 mL | Freq: Once | INTRAMUSCULAR | Status: AC | PRN
  Administered 2012-11-13: 75 mL via ORAL

## 2012-11-13 MED ORDER — MORPHINE SULFATE 2 MG/ML IJ SOLN: 1.0000 mg | INTRAMUSCULAR | Status: DC | PRN

## 2012-11-13 NOTE — Progress Notes (Signed)
Utilization review completed.  

## 2012-11-13 NOTE — Care Management Note (Signed)
    Page 1 of 1   11/13/2012     11:59:33 AM   CARE MANAGEMENT NOTE 11/13/2012  Patient:  Brittany Osborn, Brittany Osborn   Account Number:  [id number]  Date Initiated:  11/13/2012  Documentation initiated by:  Lorenda Ishihara  Subjective/Objective Assessment:   67 yo female admitted s/p nissen fundoplication. PTA lived at home with spouse.     Action/Plan:   Home with spouse.   Anticipated DC Date:  11/16/2012   Anticipated DC Plan:  HOME/SELF CARE      DC Planning Services  CM consult      Choice offered to / List presented to:             Status of service:  Completed, signed off Medicare Important Message given?   (If response is "NO", the following Medicare IM given date fields will be blank) Date Medicare IM given:   Date Additional Medicare IM given:    Discharge Disposition:  HOME/SELF CARE  Per UR Regulation:  Reviewed for med. necessity/level of care/duration of stay  If discussed at Long Length of Stay Meetings, dates discussed:    Comments:

## 2012-11-13 NOTE — Progress Notes (Addendum)
INITIAL NUTRITION ASSESSMENT  DOCUMENTATION CODES Per approved criteria  -Not Applicable   INTERVENTION: Provide Resource Breeze BID Provide Beneprotein TID with meals Provide Multivitamin with minerals daily  NUTRITION DIAGNOSIS: Inadequate oral intake related to limited diet and early satiety as evidenced by pt on clear liquid diet s/p nissen fundoplication.   Goal: Pt to meet >/= 90% of their estimated nutrition needs  Monitor:  PO intake Diet advancement/ Home diet recommendations Weight Labs  Reason for Assessment: Malnutrition Screening Tool, score of 5  67 y.o. female  Admitting Dx: Hiatal Hernia  ASSESSMENT: H&P: Brittany Osborn is a 67 y.o.white female who presents with hiatal hernia. She dates her symptoms as going on for 2 years. She complained of almost daily vomiting. She would vomit 5-10 minutes after eating. She stopped taking milk and dairy products, and is taking almond milk and this has helped some. She is eating a bland diet, staying away from fried and spicy food. She is cooking with olive oil, which seems to help.   Pt is now s/p laparoscopic nissen fundoplication and laparoscopic hiatal hernia repair. Pt reports that she weighed 180 lbs 2 years ago before symptoms started. Pt reports that she lost a lot of weight initially and has been maintain weight around 128 lbs recently. Pt states that she was primarily drinking banana smoothies, Boost, and Gatorade due to solid foods causing nausea and vomiting. Pt reports that she could not eat solid foods for more than 2 days in a row without feeling sick. Pt reports that she is doing well with clear liquids, is able to swallow but is getting full fast and feels bloated. Encouraged small frequent amounts of PO intake as tolerated.   Height: Ht Readings from Last 1 Encounters:  11/12/12 5\' 2"  (1.575 m)    Weight: Wt Readings from Last 1 Encounters:  11/12/12 124 lb (56.246 kg)    Ideal Body Weight: 110 lbs  %  Ideal Body Weight: 113%  Wt Readings from Last 10 Encounters:  11/12/12 124 lb (56.246 kg)  11/12/12 124 lb (56.246 kg)  11/08/12 124 lb 3.2 oz (56.337 kg)  10/04/12 128 lb (58.06 kg)  09/16/11 130 lb (58.968 kg)  09/16/11 130 lb (58.968 kg)    Usual Body Weight: 180 lbs  % Usual Body Weight: 69%  BMI:  Body mass index is 22.67 kg/(m^2).  Estimated Nutritional Needs: Kcal: 1460-1690 Protein: 67-78 grams Fluid: 2 L  Skin: abdominal incision with port  Nutrition Focused Physical Exam:  Subcutaneous Fat:  Orbital Region: wnl Upper Arm Region: mild wasting Thoracic and Lumbar Region: NA  Muscle:  Temple Region: wnl Clavicle Bone Region: wnl Clavicle and Acromion Bone Region: wnl Scapular Bone Region: NA Dorsal Hand: wnl Patellar Region: wnl Anterior Thigh Region: wnl Posterior Calf Region: wnl  Edema: none  Diet Order: Clear Liquid  EDUCATION NEEDS: -No education needs identified at this time   Intake/Output Summary (Last 24 hours) at 11/13/12 1620 Last data filed at 11/13/12 1418  Gross per 24 hour  Intake 2030.83 ml  Output   2550 ml  Net -519.17 ml    Last BM: PTA  Labs:   Recent Labs Lab 11/08/12 0930  NA 138  K 4.0  CL 103  CO2 27  BUN 10  CREATININE 0.65  CALCIUM 9.3  GLUCOSE 94    CBG (last 3)  No results found for this basename: GLUCAP,  in the last 72 hours  Scheduled Meds: . heparin  5,000  Units Subcutaneous Q8H  . pantoprazole (PROTONIX) IV  40 mg Intravenous Q12H    Continuous Infusions: . 0.45 % NaCl with KCl 20 mEq / L 100 mL/hr at 11/13/12 1159    Past Medical History  Diagnosis Date  . Arthritis   . Anxiety   . H/O hiatal hernia   . GERD (gastroesophageal reflux disease)   . Headache(784.0)   . Cataracts, bilateral     Past Surgical History  Procedure Laterality Date  . Esophagogastroduodenoscopy  09/16/2011    Procedure: ESOPHAGOGASTRODUODENOSCOPY (EGD);  Surgeon: Theda Belfast, MD;  [facility]  END[facility]COPY;  Service: Endoscopy;  Laterality: N/A;  Per Dr. Elnoria Howard, pt wants do procedures at [facility] instead of [facility]  . Colonoscopy  09/16/2011    Procedure: COLONOSCOPY;  Surgeon: Theda Belfast, MD;  Location: [facility] ENDOSCOPY;  Service: Endoscopy;  Laterality: N/A;  . Tonsillectomy    . Cesarean section    . Laparoscopic nissen fundoplication N/A 11/12/2012    Procedure: LAPAROSCOPIC NISSEN FUNDOPLICATION laparoscopic hiatal hernia repair  ;  Surgeon: Kandis Cocking, MD;  Location: [facility] ORS;  Service: General;  Laterality: N/A;    Brittany Osborn RD, LDN Inpatient Clinical Dietitian Pager: [phone] After Hours Pager: [phone]

## 2012-11-13 NOTE — Op Note (Signed)
NAMETHERA, BASDEN                  ACCOUNT NO.:  [id number]  MEDICAL RECORD NO.:  [id number]  LOCATION:  1537                         FACILITY:  [facility]  PHYSICIAN:  Sandria Bales. Ezzard Standing, M.D.  DATE OF BIRTH:  05-Jan-1946  DATE OF PROCEDURE:  11/12/2012                              OPERATIVE REPORT  PREOPERATIVE DIAGNOSIS:  Large hiatal hernia with grade 4 esophagitis.  POSTOPERATIVE DIAGNOSIS:  Large hiatal hernia with grade 4 esophagitis.  PROCEDURE:  Repair of hiatal hernia with Nissen fundoplication.  SURGEON:  Sandria Bales. Ezzard Standing, M.D.  FIRST ASSISTANT:  Sharlet Salina T. Hoxworth, M.D.  ANESTHESIA:  General endotracheal.  ESTIMATED BLOOD LOSS:  Minimal.  INDICATION OF PROCEDURE:  Ms. Ventrella is a 67 year old white female, who sees Dr. Marjory Lies, who is her primary care doctor.  For least 2 years, she has had almost daily vomiting.  She has vomited as much as 5-10 times a day.  She has seen Dr. Jeani Hawking, about a year ago who has noted a very large hiatal hernia with grade 4 esophagitis.  Because of financial concerns, she could not address this until this year.  An upper GI performed in May, 2014, showed a large hiatal hernia with evidence of gastric outlet partially obstructed.  I discussed with the patient about proceeding with hiatal hernia repair and probably Nissen fundoplication and possible gastrostomy.  I discussed the indications and potential complications of surgery.  Potential complications include, but are not limited to, bleeding, infection, injury to the bowel, recurrence of the hiatal hernia.  She is of normal weight.  OPERATIVE NOTE:  The patient was taken to room #1 at [facility] where she underwent a general endotracheal anesthesia.  She was given 2 g of Ancef at the initiation of procedure.  Her abdomen was prepped with ChloraPrep and sterilely draped.  A time-out was held and surgical checklist run.  I accessed her abdominal cavity with a 5 mm Optiview trocar in the left upper  quadrant.  I placed 5 additional trocars:  there was a 5 mm in the subxiphoid location for the liver retractor, a 5 mm right subcostal, there is an 11 mm right paramedian for the Endo suture, there is a 5 mm left paramedian for the scope, and then a 5 mm left lateral.  Abdominal exploration revealed right and left lobes of the liver unremarkable.  The bowel that I could see was unremarkable.  The patient probably had 70-80% of her stomach up above the diaphragm, and this was reduced.  There is noted to be some chronic scarring particularly along the greater curvature side of the proximal stomach.  I started by dissecting out the sac.  She had a large hiatal hernia, with the estimated size of about 6-7 cm.  The sac came out fairly well. She had fairly healthy-looking crus on the left and right side.  I dissected the sac off and sent this to Pathology.  I then went below the greater curvature and took down both from the scar tissue and short gastrics, which allowed the greater curvature to wrap around posterior around the stomach.  I put a Penrose drain around the esophagogastric junction and  elevated this posteriorly.  I tried to mobilize approximately 6 cm of the esophagus distally, so the GE junction get down into the abdomen.  So after mobilizing the esophagus, I then closed the hiatus posteriorly with 4 sutures of interrupted 0 Ethibond suture and these were tied with a tie knot clip.  A photo was taken before after the hiatal closure.  I then passed a #56 bougie into the stomach and did a proximal wrap using three interrupted 0 Ethibond sutures.  I placed 3 sutures for 3 suture wrap.  I tried to capture the anterior esophagus at he GE junction with the most proximal suture.  I took photos of the wrap.    In summary, I reduced the herniated stomach from the mediastinum, I had a Nissan wrap that looked good, I think I had the GE junction below the diaphragm, and there was no tension on the wrap.  It  looked liked the esophagus had adequate length.    There is no bleeding from the dissection.  The trocars were removed in turn. I used about 29 mL of 0.25% Marcaine as a local anesthetic.  I closed the skin with 5-0 Monocryl suture, painted with Dermabond.  The patient tolerated the procedure well, was transported to recovery room in good condition.  Again, photos were taken in the chart about the procedure.  I will plan an UGI tomorrow.   Sandria Bales. Ezzard Standing, M.D., FACS   DHN/MEDQ  D:  11/12/2012  T:  11/12/2012  Job:  [id number]  cc:   Jordan Hawks. Elnoria Howard, MD Fax: [phone]  Marjory Lies, M.D. Fax: [phone]

## 2012-11-13 NOTE — Progress Notes (Signed)
Spoke with Dr. Ezzard Standing states he has seen results of UGI, and ok to start patient on clear liquids

## 2012-11-13 NOTE — Progress Notes (Addendum)
General Surgery Note  LOS: 1 day  POD - 1 Day Post-Op  Assessment/Plan: 1.  LAPAROSCOPIC NISSEN FUNDOPLICATION, laparoscopic hiatal hernia repair  - D.  - 11/12/2012  For swallow today  Progressing okay  2.  DVT prophylaxis - SQ Heparin 3. Arthritis - hands and legs  4. Anxiety  5. Bronchitis since she was a child  Subjective:  Doing well, though sore.   Has not walked much.  Husband in room. Objective:   Filed Vitals:   11/13/12 0550  BP: 135/73  Pulse: 70  Temp: 99.2 F (37.3 C)  Resp: 18     Intake/Output from previous day:  06/16 0701 - 06/17 0700 In: 3181.3 [I.V.:3181.3] Out: 2170 [Urine:2150; Blood:20]  Intake/Output this shift:      Physical Exam:   General: WN WF who is alert and oriented.    HEENT: Normal. Pupils equal. .   Lungs: Clear   Abdomen: Soft.   Wound: Okay.   Lab Results:   No results found for this basename: WBC, HGB, HCT, PLT,  in the last 72 hours  BMET  No results found for this basename: NA, K, CL, CO2, GLUCOSE, BUN, CREATININE, CALCIUM,  in the last 72 hours  PT/INR  No results found for this basename: LABPROT, INR,  in the last 72 hours  ABG  No results found for this basename: PHART, PCO2, PO2, HCO3,  in the last 72 hours   Studies/Results:  No results found.   Anti-infectives:   Anti-infectives   Start     Dose/Rate Route Frequency Ordered Stop   11/12/12 0749  ceFAZolin (ANCEF) IVPB 2 g/50 mL premix     2 g 100 mL/hr over 30 Minutes Intravenous On call to O.R. 11/12/12 0749 11/12/12 1006      Ovidio Kin, MD, FACS Pager: [phone],   [facility] [place] [facility] Office: [phone] 11/13/2012

## 2012-11-14 MED ORDER — OXYCODONE-ACETAMINOPHEN 5-325 MG/5ML PO SOLN: 5.0000 mL | ORAL | Status: DC | PRN

## 2012-11-14 MED ORDER — PNEUMOCOCCAL VAC POLYVALENT 25 MCG/0.5ML IJ INJ
0.5000 mL | INJECTION | INTRAMUSCULAR | Status: DC
  Filled 2012-11-14: qty 0.5

## 2012-11-14 MED ORDER — PNEUMOCOCCAL VAC POLYVALENT 25 MCG/0.5ML IJ INJ
0.5000 mL | INJECTION | INTRAMUSCULAR | Status: AC
  Administered 2012-11-14: 0.5 mL via INTRAMUSCULAR
  Filled 2012-11-14: qty 0.5

## 2012-11-14 MED ORDER — ONDANSETRON HCL 4 MG/5ML PO SOLN: 4.0000 mg | Freq: Once | ORAL | Status: DC

## 2012-11-14 MED ORDER — PNEUMOCOCCAL VAC POLYVALENT 25 MCG/0.5ML IJ INJ: 0.5000 mL | INJECTION | INTRAMUSCULAR | Status: DC

## 2012-11-14 NOTE — Progress Notes (Signed)
Patient states understanding of discharge instructions and that the already have RX given to them by Dr. Ezzard Standing. Extra information given on full liquid diet

## 2012-11-14 NOTE — Discharge Summary (Signed)
Physician Discharge Summary  Patient ID:  Brittany Osborn  MRN: [id number]  DOB/AGE: [birth year] 67 y.o.  Admit date: 11/12/2012 Discharge date: 11/14/2012  Discharge Diagnoses:  1.  Large Hiatal hernia 2.  GERD secondary to large hiatal hernia 3. Arthritis - hands and legs  4. Anxiety  5. Bronchitis since she was a child  Operation: Procedure(s): LAPAROSCOPIC NISSEN FUNDOPLICATION laparoscopic hiatal hernia repair  on 11/12/2012 - D. [facility]  Discharged Condition: good  Hospital Course: Brittany Osborn is an 67 y.o. female whose primary care physician is Delorse Lek, MD and who was admitted 11/12/2012 with a chief complaint of a large hiatal hernia with subsequent chronic GERD.   She was brought to the operating room on 11/12/2012 and underwent  LAPAROSCOPIC NISSEN FUNDOPLICATION laparoscopic hiatal hernia repair.  She had a Esophageal swallow the day after surgery that showed the hiatal hernia repair and the Nissen fundoplication were in good position.  She was started on clear liquids which she has tolerated. She is now ready to go home.  Her husband and son are in the room with the patient.  The discharge instructions were reviewed with the patient.  Consults: None  Significant Diagnostic Studies: Results for orders placed during the hospital encounter of 11/08/12  SURGICAL PCR SCREEN      Result Value Range   MRSA, PCR NEGATIVE  NEGATIVE   Staphylococcus aureus NEGATIVE  NEGATIVE  CBC      Result Value Range   WBC 2.4 (*) 4.0 - 10.5 K/uL   RBC 4.22  3.87 - 5.11 MIL/uL   Hemoglobin 9.2 (*) 12.0 - 15.0 g/dL   HCT 78.2 (*) 95.6 - 21.3 %   MCV 74.6 (*) 78.0 - 100.0 fL   MCH 21.8 (*) 26.0 - 34.0 pg   MCHC 29.2 (*) 30.0 - 36.0 g/dL   RDW 08.6 (*) 57.8 - 46.9 %   Platelets 280  150 - 400 K/uL  BASIC METABOLIC PANEL      Result Value Range   Sodium 138  135 - 145 mEq/L   Potassium 4.0  3.5 - 5.1 mEq/L   Chloride 103  96 - 112 mEq/L   CO2 27  19 - 32 mEq/L   Glucose, Bld 94  70 -  99 mg/dL   BUN 10  6 - 23 mg/dL   Creatinine, Ser 6.29  0.50 - 1.10 mg/dL   Calcium 9.3  8.4 - 52.8 mg/dL   GFR calc non Af Amer >90  >90 mL/min   GFR calc Af Amer >90  >90 mL/min    Dg Ugi W/water Sol Cm  11/13/2012   *RADIOLOGY REPORT*  Clinical Data:  Status post Nissen fundoplication hiatal hernia repair yesterday.  Esophagitis.  UPPER GI SERIES WITH KUB  Technique:  Routine upper GI series was performed with water soluble contrast  Fluoroscopy Time: 2 minutes 41 seconds  Comparison:  10/09/2012  Findings: The preliminary supine radiograph of the abdomen demonstrates multiple mildly dilated small bowel loops.  There are multiple surgical clips in the left upper abdomen.  The patient swallowed the water soluble contrast without difficulty.  This passed normally through the esophagus into the stomach.  There is distal esophageal irregularity without stricture.  The previously seen hiatal hernia is no longer demonstrated.  The contrast passed normally through the stomach and into the proximal small bowel.  No contrast extravasation or reflux was seen.  IMPRESSION:  1.  Status post hiatal hernia repair without complication. 2.  Irregularity of the distal esophagus, possibly due to the Nissen fundoplication or the patient's known esophagitis. 3.  Mild small bowel postoperative ileus.   Original Report Authenticated By: Beckie Salts, M.D.    Discharge Exam:  Filed Vitals:   11/14/12 1012  BP: 136/56  Pulse: 76  Temp: 98.6 F (37 C)  Resp: 16    General: WN older WF who is alert and generally healthy appearing.  Lungs: Clear to auscultation and symmetric breath sounds. Heart:  RRR. No murmur or rub. Abdomen: Soft. No tenderness. No hernia. Normal bowel sounds. Her incisions look good.  Discharge Medications:     Medication List    TAKE these medications       aspirin EC 81 MG tablet  Take 81 mg by mouth at bedtime.     DRAMAMINE PO  Take 1 tablet by mouth every 6 (six) hours as  needed. Nausea     ondansetron 4 MG/5ML solution  Commonly known as:  ZOFRAN  Take 5 mLs (4 mg total) by mouth once.     oxyCODONE-acetaminophen 5-325 MG/5ML solution  Commonly known as:  ROXICET  Take 5-10 mLs by mouth every 4 (four) hours as needed.     polyethylene glycol powder powder  Commonly known as:  GLYCOLAX/MIRALAX  Take 17 g by mouth daily as needed. constipation     ranitidine 150 MG capsule  Commonly known as:  ZANTAC  Take 150 mg by mouth 2 (two) times daily as needed for heartburn.        Disposition: 01-Home or Self Care      Discharge Orders   Future Orders Complete By Expires     Increase activity slowly  As directed         [facility] - DISCHARGE INSTRUCTIONS TO PATIENT  Activity:  Driving - N/A   Lifting - No lifting > 15 pounds for 1 week.  Wound Care:   May shower.    Diet:  Clear liquids today.  Start full liquids tomorrow.  Stay on full liquids for two weeks.  You have a sheet on full liquids.  Follow up appointment:  Call Dr. Allene Pyo office [facility]) at [phone] for an appointment in 2 to 3 weeks.  Medications and dosages:  Resume your home medications.  You have a prescription for:  Roxicet and Zofran  Call Dr. Ezzard Standing or his office  [phone]) if you have:  Temperature greater than 100.4,  Persistent nausea and vomiting,  Severe uncontrolled pain,  Redness, tenderness, or signs of infection (pain, swelling, redness, odor or green/yellow discharge around the site),  Difficulty breathing, headache or visual disturbances,  Any other questions or concerns you may have after discharge.  In an emergency, call 911 or go to an Emergency Department at a nearby hospital.  Signed: Ovidio Kin, M.D., [facility]  11/14/2012, 4:37 PM

## 2012-11-29 ENCOUNTER — Ambulatory Visit (INDEPENDENT_AMBULATORY_CARE_PROVIDER_SITE_OTHER): Payer: Medicare Other | Admitting: Surgery

## 2012-11-29 VITALS — BP 124/60 | HR 60 | Temp 98.0°F | Resp 18 | Ht 64.0 in | Wt 121.0 lb

## 2012-11-29 DIAGNOSIS — Z8719 Personal history of other diseases of the digestive system: Secondary | ICD-10-CM | POA: Insufficient documentation

## 2012-11-29 NOTE — Progress Notes (Signed)
Re:   Brittany Osborn DOB:   10-04-1945 MRN:   [id number]  ASSESSMENT AND PLAN: 1.  Hiatal Hernia, approx 8 cm on upper endo.  Grade 4 esophagitis.  Repaired with Nissen fundoplication - D.  - 11/12/2012  She has done exceptionally well.  She is very happy with her results.  She is going to add solids back slowly.  I'll see her back in 6 months.  If she is doing well, then that could be her last visit.  2.  Persistent nausea and vomiting   Resolved with surgery. 3.  Arthritis - hands and legs 4.  Anxiety 5.  Bronchitis since she was a child  Coughs and tight chest, but since she has gotten off milk, she has gotten better.  Chief Complaint  Patient presents with  . Routine Post Op    hital hernia   REFERRING PHYSICIAN:   Dr. Chip Boer  HISTORY OF PRESENT ILLNESS: Brittany Osborn is a 67 y.o. (DOB: [birth year])  white  female whose primary care physician is BURNETT,BRENT A, MD and comes to me today for follow up of hiatal hernia repair.  Her son is with her.  She has done very well.  She is very pleased with the results.  She's had no dysphagia or vomiting.  I talked about adding solid food back to her diet.  History of Hiatal hernia: She dates her symptoms as going on for 2 years. She complained of almost daily vomiting. She would vomit 5-10 minutes after eating. She occasionally vomits at night, but not usually. She stopped taking milk and dairy products, and is taking almond milk and this has helped some. She has no burning when she eats her food, but she had burning when she vomits.  She has noticed no blood in her vomit.  She is eating a bland diet, staying away from fried and spicy food.  She is cooking with olive oil, which seems to help.   Dramamine seems to help her nausea and Benadryl seems to help her sleep.   She is on Ranitidine and Zegerid, but I'm a little unclear how much these are helping her.  She's had some trouble with constipation - metamucil sometimes helps.  Brittany Osborn underwent an upper endo by Dr. Elnoria Howard on 09/16/2011 - he noted an 8 cm HH and grade 4 esophagitis.  She also underwent a colonoscopy 09/16/2011 which showed some diverticulosis.  He found a hyperplastic polyp.  She had a [place] on 09/09/2011 that was negative.  On a CXR in 2009, I can see what I think is the suggestion of a HH.  I do not have a report in Epic.  Part of the issues of her getting care has been that she had no insurance.  She now has Medicare, so she has some financing. She has no history of liver, gallbladder or GYN problems. She's had no prior abdominal surgery other than a C-section for her son.    Past Medical History  Diagnosis Date  . Arthritis   . Anxiety   . H/O hiatal hernia   . GERD (gastroesophageal reflux disease)   . Headache(784.0)   . Cataracts, bilateral     Current Outpatient Prescriptions  Medication Sig Dispense Refill  . aspirin EC 81 MG tablet Take 81 mg by mouth at bedtime.      . polyethylene glycol powder (GLYCOLAX/MIRALAX) powder Take 17 g by mouth daily as needed. constipation      .  DimenhyDRINATE (DRAMAMINE PO) Take 1 tablet by mouth every 6 (six) hours as needed. Nausea      . ondansetron (ZOFRAN) 4 MG/5ML solution Take 5 mLs (4 mg total) by mouth once.  50 mL  0  . oxyCODONE-acetaminophen (ROXICET) 5-325 MG/5ML solution Take 5-10 mLs by mouth every 4 (four) hours as needed.  200 mL  0  . ranitidine (ZANTAC) 150 MG capsule Take 150 mg by mouth 2 (two) times daily as needed for heartburn.        No current facility-administered medications for this visit.   Allergies  Allergen Reactions  . Strawberry Anaphylaxis  . Sulfa Drugs Cross Reactors Nausea And Vomiting   REVIEW OF SYSTEMS: Pulmonary:  Bronchitis since a child. Gastrointestinal:  See HPI.   Musculoskeletal: Osteoarthritis of hands and legs.  SOCIAL and FAMILY HISTORY: Married.  Husband Happy Begeman  (DOB - 10/09/1939) (I did a bilateral inguinal hernia on him in 01/2011) Son also in  room.  They also have a daguhter.  PHYSICAL EXAM: BP 124/60  Pulse 60  Temp(Src) 98 F (36.7 C)  Resp 18  Ht 5\' 4"  (1.626 m)  Wt 121 lb (54.885 kg)  BMI 20.76 kg/m2  General: WN thin WF who is alert and generally healthy appearing.  Abdomen: Soft. No mass. No tenderness. No hernia. Normal bowel sounds.  Incisions look good.  DATA REVIEWED: Path report of hernia sac to the patient.  Ovidio Kin, MD,  [facility] [address] [place]  [address]   [place], [place]    [address] Phone:  [phone] FAX:  [phone]

## 2013-02-26 ENCOUNTER — Encounter (INDEPENDENT_AMBULATORY_CARE_PROVIDER_SITE_OTHER): Payer: Self-pay

## 2013-06-20 ENCOUNTER — Encounter (INDEPENDENT_AMBULATORY_CARE_PROVIDER_SITE_OTHER): Payer: Self-pay | Admitting: Surgery

## 2013-06-20 ENCOUNTER — Ambulatory Visit (INDEPENDENT_AMBULATORY_CARE_PROVIDER_SITE_OTHER): Payer: Medicare Other | Admitting: Surgery

## 2013-06-20 VITALS — BP 150/80 | HR 68 | Temp 97.1°F | Resp 14 | Ht 62.0 in | Wt 144.2 lb

## 2013-06-20 DIAGNOSIS — Z8719 Personal history of other diseases of the digestive system: Secondary | ICD-10-CM

## 2013-06-20 NOTE — Progress Notes (Signed)
Re:   Brittany Osborn DOB:   [birth year] MRN:   [id number]  ASSESSMENT AND PLAN: 1.  Hiatal Hernia, approx 8 cm on upper endo.  With grade 4 esophagitis.  Repaired with Nissen fundoplication - D.  - 11/12/2012  She has done very well.    I made her return visit PRN>   2.  Arthritis - hands and legs 3.  Anxiety 4.  Bronchitis since she was a child  Chief Complaint  Patient presents with  . Follow-up    LTFU routine p/o Hiatal Hernia   REFERRING PHYSICIAN:   Dr. Benny Lennert  HISTORY OF PRESENT ILLNESS: Brittany Osborn is a 68 y.o. (DOB: [birth year])  white  female whose primary care physician is Brittany Osborn,Brittany A, MD and comes to me today for a 6 month follow up of hiatal hernia repair.  Her son is with her.  She has done very well.  So well, that she has gained about 20 pounds. She is worried about gaining too much and we talked about not gaining too much weight. She is a little constipated and we talked about that.  Otherwise she is very happy with her results.  History of Hiatal hernia: She dates her symptoms as going on for 2 years. She complained of almost daily vomiting. She would vomit 5-10 minutes after eating. She occasionally vomits at night, but not usually. She stopped taking milk and dairy products, and is taking almond milk and this has helped some. She has no burning when she eats her food, but she had burning when she vomits.  She has noticed no blood in her vomit.  She is eating a bland diet, staying away from fried and spicy food.  She is cooking with olive oil, which seems to help.   Dramamine seems to help her nausea and Benadryl seems to help her sleep.   She is on Ranitidine and Zegerid, but I'm a little unclear how much these are helping her.  She's had some trouble with constipation - metamucil sometimes helps.  Brittany Osborn underwent an upper endo by Dr. Benson Norway on 09/16/2011 - he noted an 8 cm HH and grade 4 esophagitis.  She also underwent a colonoscopy 09/16/2011 which showed  some diverticulosis.  He found a hyperplastic polyp.  She had a [place] on 09/09/2011 that was negative.  On a CXR in 2009, I can see what I think is the suggestion of a HH.  I do not have a report in Epic.  Part of the issues of her getting care has been that she had no insurance.  She now has Medicare, so she has some financing. She has no history of liver, gallbladder or GYN problems. She's had no prior abdominal surgery other than a C-section for her son.    Past Medical History  Diagnosis Date  . Arthritis   . Anxiety   . H/O hiatal hernia   . GERD (gastroesophageal reflux disease)   . Headache(784.0)   . Cataracts, bilateral     Current Outpatient Prescriptions  Medication Sig Dispense Refill  . aspirin EC 81 MG tablet Take 81 mg by mouth at bedtime.      . DimenhyDRINATE (DRAMAMINE PO) Take 1 tablet by mouth every 6 (six) hours as needed. Nausea      . ondansetron (ZOFRAN) 4 MG/5ML solution Take 5 mLs (4 mg total) by mouth once.  50 mL  0  . polyethylene glycol powder (GLYCOLAX/MIRALAX) powder Take 17 g by  mouth daily as needed. constipation      . ranitidine (ZANTAC) 150 MG capsule Take 150 mg by mouth 2 (two) times daily as needed for heartburn.        No current facility-administered medications for this visit.   Allergies  Allergen Reactions  . Strawberry Anaphylaxis  . Sulfa Drugs Cross Reactors Nausea And Vomiting   REVIEW OF SYSTEMS: Pulmonary:  Bronchitis since a child. Gastrointestinal:  See HPI.   Musculoskeletal: Osteoarthritis of hands and legs.  SOCIAL and FAMILY HISTORY: Married.  Husband Brittany Osborn  (DOB - 10/09/1939) (I did a bilateral inguinal hernia on him in 01/2011) Son also in room.  They also have a daguhter.  PHYSICAL EXAM: BP 150/80  Pulse 68  Temp(Src) 97.1 F (36.2 C) (Temporal)  Resp 14  Ht 5\' 2"  (1.575 m)  Wt 144 lb 3.2 oz (65.409 kg)  BMI 26.37 kg/m2  General: WN thin WF who is alert and generally healthy appearing.  Abdomen: Soft. No  mass. No tenderness. No hernia. Normal bowel sounds.  Incisions look good.  DATA REVIEWED: Epic notes.  Alphonsa Overall, MD,  [facility], [address]  [place], [place]    [place] Phone:  [phone] FAX:  [phone]

## 2018-04-02 ENCOUNTER — Other Ambulatory Visit: Payer: Self-pay

## 2018-04-02 ENCOUNTER — Ambulatory Visit (INDEPENDENT_AMBULATORY_CARE_PROVIDER_SITE_OTHER): Payer: Medicare Other | Admitting: Internal Medicine

## 2018-04-02 ENCOUNTER — Encounter: Payer: Self-pay | Admitting: Internal Medicine

## 2018-04-02 ENCOUNTER — Encounter (INDEPENDENT_AMBULATORY_CARE_PROVIDER_SITE_OTHER): Payer: Self-pay

## 2018-04-02 VITALS — BP 142/79 | HR 70 | Temp 98.6°F | Ht 62.0 in | Wt 156.0 lb

## 2018-04-02 DIAGNOSIS — M1990 Unspecified osteoarthritis, unspecified site: Secondary | ICD-10-CM | POA: Diagnosis not present

## 2018-04-02 DIAGNOSIS — I10 Essential (primary) hypertension: Secondary | ICD-10-CM

## 2018-04-02 DIAGNOSIS — I1310 Hypertensive heart and chronic kidney disease without heart failure, with stage 1 through stage 4 chronic kidney disease, or unspecified chronic kidney disease: Secondary | ICD-10-CM | POA: Diagnosis not present

## 2018-04-02 DIAGNOSIS — I8392 Asymptomatic varicose veins of left lower extremity: Secondary | ICD-10-CM | POA: Diagnosis not present

## 2018-04-02 DIAGNOSIS — G510 Bell's palsy: Secondary | ICD-10-CM | POA: Diagnosis not present

## 2018-04-02 DIAGNOSIS — Z0000 Encounter for general adult medical examination without abnormal findings: Secondary | ICD-10-CM

## 2018-04-02 DIAGNOSIS — Z8719 Personal history of other diseases of the digestive system: Secondary | ICD-10-CM

## 2018-04-02 DIAGNOSIS — Z7982 Long term (current) use of aspirin: Secondary | ICD-10-CM

## 2018-04-02 NOTE — Patient Instructions (Addendum)
Brittany Osborn,   Thank you for allowing [place] to provide your care today. It was a pleasure to see you again.Today we discussed your chronic medical conditions.    I have ordered some labs for you. I will call if any are abnormal.    Today we made no changes to your medications.   I have sent in a referral for a mammogram.  We noticed that your blood pressure was elevated. Please consider starting the DASH diet. We have a nutritionist that you can meet with if you are interested. Please increase your exercise activity to at least 30 minutes 5 times a week if you can.  We will recheck your blood pressure on your next visit to see if anything needs to be added.   Please follow-up in 3-4 months to see how you are doing.    Should you have any questions or concerns please call the internal medicine clinic at [phone].

## 2018-04-02 NOTE — Progress Notes (Signed)
CC: Establish care  HPI:  Brittany Osborn is a 72 y.o. with a history listed below presenting to establish care. 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 She did note elevated blood a few weeks ago, reports that it was up to 045 systolic, she states that she started taking apple cider vinegar pills and that this has decreased her blood pressure. She reports that her diet has not been very good, she eats does eat salads, smoothies, boost, and she reports that she eats potato chips often. 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 She reports that the only medication that she takes is aspirin 81 mg daily.    She does report that she has noticed some left foot swelling and "bruising", she states that sometimes it gets bright red and swell. She denies any pain when the happens. The swelling goes away after she elevates her feet. She has not tried anything for this.   Please see A&P for status of the patient's chronic medical conditions  Past Medical History:  Diagnosis Date  . Anxiety   . Arthritis   . Cataracts, bilateral   . GERD (gastroesophageal reflux disease)   . H/O hiatal hernia   . Headache(784.0)    Review of Systems: Refer to history of present illness and assessment and plans for pertinent review of systems, all others reviewed and negative.  Physical Exam:  Vitals:   04/02/18 1353  BP: (!) 142/79  Pulse: 70  Temp: 98.6 F (37 C)  TempSrc: Oral  SpO2: 95%  Weight: 156 lb (70.8 kg)  Height: 5\' 2"  (1.575 m)   Physical Exam  Constitutional: She is oriented to person, place, and time and well-developed, well-nourished, and in no distress.  HENT:  Head: Normocephalic and atraumatic.  Eyes: Pupils are equal, round, and reactive to light. Conjunctivae and EOM are normal.  Neck: Normal range of motion. Neck supple. No thyromegaly present.  Cardiovascular:  Normal rate, regular rhythm and normal heart sounds. Exam reveals no gallop and no friction rub.  No murmur heard. Pulmonary/Chest: Effort normal and breath sounds normal. No respiratory distress. She has no wheezes.  Abdominal: Soft. Bowel sounds are normal. She exhibits no distension.  Musculoskeletal: Normal range of motion.  Neurological: She is alert and oriented to person, place, and time. Gait normal.  Strength: 4/5 in upper and lower extremity. Mild left facial droop (patient reports this is chronic)  Skin: Skin is warm and dry. No erythema.  Left foot: varicose veins on dorsal aspect and ankle  Psychiatric: Mood and affect normal.     Social History   Socioeconomic History  . Marital status: Married    Spouse name: Not on file  . Number of children: Not on file  . Years of education: Not on file  . Highest education level: Not on file  Occupational History  . Not on file  Social Needs  . Financial resource strain: Not on file  . Food insecurity:    Worry: Not on file    Inability: Not on file  . Transportation needs:    Medical: Not on file    Non-medical: Not on file  Tobacco Use  . Smoking status: Never Smoker  . Smokeless tobacco: Never Used  Substance and Sexual Activity  . Alcohol use: No  . Drug use: No  . Sexual activity: Not on file  Lifestyle  . Physical activity:    Days per week: Not on file    Minutes per session: Not on file  . Stress: Not on file  Relationships  . Social connections:    Talks on phone: Not on file    Gets together: Not on file    Attends religious service: Not on file    Active member of club or organization: Not on file    Attends meetings of clubs or organizations: Not on file    Relationship status: Not on file  . Intimate partner violence:    Fear of current or ex partner: Not on file    Emotionally abused: Not on file    Physically abused: Not on file    Forced sexual activity: Not on file  Other Topics Concern  .  Not on file  Social History Narrative  . Not on file    Family History  Problem Relation Age of Onset  . Heart disease Mother   . Lung cancer Father     Assessment & Plan:   See Encounters Tab for problem based charting.  Patient seen with Dr. Rebeca Alert

## 2018-04-03 ENCOUNTER — Encounter: Payer: Self-pay | Admitting: Internal Medicine

## 2018-04-03 DIAGNOSIS — I8392 Asymptomatic varicose veins of left lower extremity: Secondary | ICD-10-CM | POA: Insufficient documentation

## 2018-04-03 DIAGNOSIS — Z0000 Encounter for general adult medical examination without abnormal findings: Secondary | ICD-10-CM | POA: Insufficient documentation

## 2018-04-03 DIAGNOSIS — I10 Essential (primary) hypertension: Secondary | ICD-10-CM | POA: Insufficient documentation

## 2018-04-03 LAB — CBC WITH DIFFERENTIAL/PLATELET
BASOS: 1 %
Basophils Absolute: 0 10*3/uL (ref 0.0–0.2)
EOS (ABSOLUTE): 0.2 10*3/uL (ref 0.0–0.4)
EOS: 4 %
HEMATOCRIT: 44.2 % (ref 34.0–46.6)
Hemoglobin: 15.3 g/dL (ref 11.1–15.9)
IMMATURE GRANULOCYTES: 0 %
Immature Grans (Abs): 0 10*3/uL (ref 0.0–0.1)
Lymphocytes Absolute: 1.4 10*3/uL (ref 0.7–3.1)
Lymphs: 30 %
MCH: 31.9 pg (ref 26.6–33.0)
MCHC: 34.6 g/dL (ref 31.5–35.7)
MCV: 92 fL (ref 79–97)
MONOS ABS: 0.3 10*3/uL (ref 0.1–0.9)
Monocytes: 7 %
NEUTROS PCT: 58 %
Neutrophils Absolute: 2.6 10*3/uL (ref 1.4–7.0)
Platelets: 245 10*3/uL (ref 150–450)
RBC: 4.79 x10E6/uL (ref 3.77–5.28)
RDW: 12.1 % — AB (ref 12.3–15.4)
WBC: 4.6 10*3/uL (ref 3.4–10.8)

## 2018-04-03 LAB — BMP8+ANION GAP
ANION GAP: 23 mmol/L — AB (ref 10.0–18.0)
BUN/Creatinine Ratio: 16 (ref 12–28)
BUN: 13 mg/dL (ref 8–27)
CALCIUM: 9.2 mg/dL (ref 8.7–10.3)
CO2: 18 mmol/L — ABNORMAL LOW (ref 20–29)
CREATININE: 0.79 mg/dL (ref 0.57–1.00)
Chloride: 101 mmol/L (ref 96–106)
GFR calc Af Amer: 88 mL/min/{1.73_m2} (ref >59)
GFR calc non Af Amer: 76 mL/min/{1.73_m2} (ref >59)
Glucose: 82 mg/dL (ref 65–99)
Potassium: 4.5 mmol/L (ref 3.5–5.2)
SODIUM: 142 mmol/L (ref 134–144)

## 2018-04-03 LAB — HEPATITIS C ANTIBODY

## 2018-04-03 LAB — HIV ANTIBODY (ROUTINE TESTING W REFLEX): HIV SCREEN 4TH GENERATION: NONREACTIVE

## 2018-04-03 NOTE — Assessment & Plan Note (Addendum)
Assessment: She has blanchable varicose veins on her left foot and ankle. She denies any pain in the area.  She reports that the ankle will swell sometimes and that it improves with elevation.   Plan: -Advised patient to get compression socks for the swelling and to keep legs elevated

## 2018-04-03 NOTE — Assessment & Plan Note (Signed)
Assessment: Patient's blood pressure today was 142/79. She reports that it was up to 003 systolic a few weeks ago and that she started taking apple cider vinegar pills which has helped bring the blood pressure down. She reports that she does not eat "properly", she endorses frequently eating potato chips which causes her to swell up. She states that she is not interested in starting any medications at this time because of the side effects that she saw when her husband took blood pressure medications. We discussed that we can work on lifestyle modicficaitonas for now and see how her blood pressure is doing in a few months with that. We discussed the DASH diet and the option to meet with the nutritionist. She stated that she would like to read up on it on her own for now.   Plan: -Gave patient information about the DASH diet -Can place dietitian referral if patient becomes interested -No medications for now, RTC in 4-5 months to reassess blood pressure -CBC, BMP

## 2018-04-03 NOTE — Assessment & Plan Note (Signed)
Placed a referral for a mammogram. Will also check Hep C and HIV.

## 2018-04-04 ENCOUNTER — Encounter: Payer: Self-pay | Admitting: Dietician

## 2018-04-04 ENCOUNTER — Telehealth: Payer: Self-pay | Admitting: Dietician

## 2018-04-04 NOTE — Telephone Encounter (Signed)
Helped Brittany Osborn feel more confident in adopting the DASH diet to lower her blood pressure. Encouraged she begin by trying to eat more fruits and vegetables. She verbalized understanding and determination to care for her self.  [name], RD 04/04/2018 2:03 PM.

## 2018-04-06 NOTE — Progress Notes (Signed)
Internal Medicine Clinic Attending  I saw and evaluated the patient.  I personally confirmed the key portions of the history and exam documented by Dr. Krienke and I reviewed pertinent patient test results.  The assessment, diagnosis, and plan were formulated together and I agree with the documentation in the resident's note.  Alexander Raines, M.D., Ph.D.  

## 2018-05-10 ENCOUNTER — Telehealth: Payer: Self-pay | Admitting: Internal Medicine

## 2018-07-05 ENCOUNTER — Ambulatory Visit: Payer: Medicare Other

## 2018-07-12 ENCOUNTER — Other Ambulatory Visit: Payer: Self-pay

## 2018-07-12 ENCOUNTER — Ambulatory Visit (INDEPENDENT_AMBULATORY_CARE_PROVIDER_SITE_OTHER): Payer: Medicare Other | Admitting: Internal Medicine

## 2018-07-12 VITALS — BP 139/64 | HR 68 | Temp 98.4°F | Wt 157.5 lb

## 2018-07-12 DIAGNOSIS — Z79899 Other long term (current) drug therapy: Secondary | ICD-10-CM

## 2018-07-12 DIAGNOSIS — G44209 Tension-type headache, unspecified, not intractable: Secondary | ICD-10-CM | POA: Diagnosis not present

## 2018-07-12 DIAGNOSIS — I10 Essential (primary) hypertension: Secondary | ICD-10-CM

## 2018-07-12 DIAGNOSIS — Z7982 Long term (current) use of aspirin: Secondary | ICD-10-CM | POA: Diagnosis not present

## 2018-07-12 NOTE — Patient Instructions (Signed)
Ms. Deveney,  It was a pleasure to meet you. For your blood pressure, I recommend a low salt diet. Try limiting your total daily salt intake to 2 grams or less. My blood pressure goal for you is less than 130/80 mm Hg.  For your headaches, I would avoid Excedrin as this has aspirin in it and may increase your risk of bleeding. Please try ibuprofen or aleve instead.   I will call you with the results of your blood work. Please follow up with your primary care doctor again in 6 months.   If you have any questions or concerns, call our clinic at [phone] or after hours call [phone] and ask for the internal medicine resident on call. Thank you!  Dr. Philipp Ovens

## 2018-07-12 NOTE — Assessment & Plan Note (Addendum)
Patient is here for blood pressure follow-up.  She was uncontrolled at her last visit with systolic blood pressure in the 160s.  She declined medication at that time and chose to work on lifestyle and dietary modifications.  She has been checking her blood pressure at home and was 579 systolic this morning. She reports it fluctuates between 150s and 110s. She has not been paying attention to her salt intake.  She stopped cooking recently due to a decline in her husband's vascular dementia and lack of appetite.  She eats mostly prepackaged food.  Today we discussed low-salt diet recommendations. She will begin checking food labels and attempt to limit her intake to 2 grams daily. She will call if her blood pressure at home becomes persistently elevated.  BMP checked at her last visit was notable for an anion gap acidosis.  She is asymptomatic today.  -- Recheck BMP - Follow-up 6 months PCP  ADDENDUM: BMP within normal limits. Left VM with results.

## 2018-07-12 NOTE — Assessment & Plan Note (Signed)
Recently, patient has been experiencing bilateral frontal headaches that she feels are related to stress.  She has been taking Tylenol and Excedrin as needed.  Of note, she is also taking an aspirin 81 mg daily for primary prevention that she initiated herself.  Advised patient not to take Excedrin while on daily aspirin, as it also contains aspirin and can increase her risk of bleeding.  She has normal renal function.  Advised PRN NSAIDs for headache.  Instructed her to return if headaches become more frequent or persist despite treatment with NSAIDs. --Advised good hydration and adequate sleep -NSAIDs as needed -Return precautions given

## 2018-07-12 NOTE — Progress Notes (Signed)
Internal Medicine Clinic Attending  Case discussed with Dr. Guilloud at the time of the visit.  We reviewed the resident's history and exam and pertinent patient test results.  I agree with the assessment, diagnosis, and plan of care documented in the resident's note.  

## 2018-07-12 NOTE — Progress Notes (Signed)
   CC: Blood pressure follow up  HPI:  [name] is a 73 y.o. female with past medical history outlined below here for blood pressure follow up. For the details of today's visit, please refer to the assessment and plan.  Past Medical History:  Diagnosis Date  . Anxiety   . Arthritis   . Cataracts, bilateral   . GERD (gastroesophageal reflux disease)   . H/O hiatal hernia   . Headache(784.0)     Review of Systems  Respiratory: Negative for shortness of breath.   Cardiovascular: Negative for chest pain.  Neurological: Positive for headaches.    Physical Exam:  Vitals:   07/12/18 1448  BP: 139/64  Pulse: 68  Temp: 98.4 F (36.9 C)  TempSrc: Oral  SpO2: 98%  Weight: 157 lb 8 oz (71.4 kg)    Constitutional: NAD, appears comfortable Cardiovascular: RRR, no m/r/g Pulmonary/Chest: CTAB, no wheezes, rales, or rhonchi.  Extremities: Warm and well perfused. No edema.  Psychiatric: Normal mood and affect  Assessment & Plan:   See Encounters Tab for problem based charting.  Patient discussed with Dr. Lynnae Osborn

## 2018-07-13 LAB — BMP8+ANION GAP
ANION GAP: 16 mmol/L (ref 10.0–18.0)
BUN/Creatinine Ratio: 22 (ref 12–28)
BUN: 18 mg/dL (ref 8–27)
CALCIUM: 9 mg/dL (ref 8.7–10.3)
CHLORIDE: 103 mmol/L (ref 96–106)
CO2: 21 mmol/L (ref 20–29)
Creatinine, Ser: 0.81 mg/dL (ref 0.57–1.00)
GFR calc Af Amer: 85 mL/min/{1.73_m2} (ref >59)
GFR calc non Af Amer: 74 mL/min/{1.73_m2} (ref >59)
GLUCOSE: 90 mg/dL (ref 65–99)
Potassium: 4.2 mmol/L (ref 3.5–5.2)
Sodium: 140 mmol/L (ref 134–144)

## 2018-10-16 ENCOUNTER — Emergency Department (HOSPITAL_COMMUNITY): Payer: Medicare Other

## 2018-10-16 ENCOUNTER — Other Ambulatory Visit: Payer: Self-pay

## 2018-10-16 ENCOUNTER — Telehealth: Payer: Self-pay | Admitting: *Deleted

## 2018-10-16 ENCOUNTER — Inpatient Hospital Stay (HOSPITAL_COMMUNITY)
Admission: EM | Admit: 2018-10-16 | Discharge: 2018-10-18 | DRG: 062 | Disposition: A | Discharge status: Home Health | Payer: Medicare Other | Attending: Neurology | Admitting: Neurology

## 2018-10-16 ENCOUNTER — Encounter (HOSPITAL_COMMUNITY): Payer: Self-pay

## 2018-10-16 DIAGNOSIS — R51 Headache: Secondary | ICD-10-CM

## 2018-10-16 DIAGNOSIS — I639 Cerebral infarction, unspecified: Secondary | ICD-10-CM

## 2018-10-16 DIAGNOSIS — R531 Weakness: Secondary | ICD-10-CM | POA: Diagnosis not present

## 2018-10-16 DIAGNOSIS — F419 Anxiety disorder, unspecified: Secondary | ICD-10-CM | POA: Diagnosis present

## 2018-10-16 DIAGNOSIS — K219 Gastro-esophageal reflux disease without esophagitis: Secondary | ICD-10-CM | POA: Diagnosis present

## 2018-10-16 DIAGNOSIS — E663 Overweight: Secondary | ICD-10-CM | POA: Diagnosis present

## 2018-10-16 DIAGNOSIS — I10 Essential (primary) hypertension: Secondary | ICD-10-CM | POA: Diagnosis not present

## 2018-10-16 DIAGNOSIS — G44209 Tension-type headache, unspecified, not intractable: Secondary | ICD-10-CM | POA: Diagnosis not present

## 2018-10-16 DIAGNOSIS — R29818 Other symptoms and signs involving the nervous system: Secondary | ICD-10-CM | POA: Diagnosis not present

## 2018-10-16 DIAGNOSIS — Z8673 Personal history of transient ischemic attack (TIA), and cerebral infarction without residual deficits: Secondary | ICD-10-CM | POA: Diagnosis present

## 2018-10-16 DIAGNOSIS — Z801 Family history of malignant neoplasm of trachea, bronchus and lung: Secondary | ICD-10-CM | POA: Diagnosis not present

## 2018-10-16 DIAGNOSIS — E785 Hyperlipidemia, unspecified: Secondary | ICD-10-CM | POA: Diagnosis present

## 2018-10-16 DIAGNOSIS — Z7982 Long term (current) use of aspirin: Secondary | ICD-10-CM

## 2018-10-16 DIAGNOSIS — E162 Hypoglycemia, unspecified: Secondary | ICD-10-CM | POA: Diagnosis not present

## 2018-10-16 DIAGNOSIS — G459 Transient cerebral ischemic attack, unspecified: Secondary | ICD-10-CM | POA: Diagnosis not present

## 2018-10-16 DIAGNOSIS — R29703 NIHSS score 3: Secondary | ICD-10-CM | POA: Diagnosis present

## 2018-10-16 DIAGNOSIS — Z6829 Body mass index (BMI) 29.0-29.9, adult: Secondary | ICD-10-CM | POA: Diagnosis not present

## 2018-10-16 DIAGNOSIS — Z1159 Encounter for screening for other viral diseases: Secondary | ICD-10-CM

## 2018-10-16 DIAGNOSIS — I371 Nonrheumatic pulmonary valve insufficiency: Secondary | ICD-10-CM | POA: Diagnosis not present

## 2018-10-16 DIAGNOSIS — M1990 Unspecified osteoarthritis, unspecified site: Secondary | ICD-10-CM | POA: Diagnosis present

## 2018-10-16 DIAGNOSIS — G8190 Hemiplegia, unspecified affecting unspecified side: Secondary | ICD-10-CM | POA: Diagnosis not present

## 2018-10-16 DIAGNOSIS — Z8249 Family history of ischemic heart disease and other diseases of the circulatory system: Secondary | ICD-10-CM

## 2018-10-16 LAB — COMPREHENSIVE METABOLIC PANEL
ALT: 18 U/L (ref 0–44)
AST: 21 U/L (ref 15–41)
Albumin: 4.2 g/dL (ref 3.5–5.0)
Alkaline Phosphatase: 53 U/L (ref 38–126)
Anion gap: 10 (ref 5–15)
BUN: 14 mg/dL (ref 8–23)
CO2: 24 mmol/L (ref 22–32)
Calcium: 8.9 mg/dL (ref 8.9–10.3)
Chloride: 104 mmol/L (ref 98–111)
Creatinine, Ser: 0.74 mg/dL (ref 0.44–1.00)
GFR calc Af Amer: >60 mL/min (ref >60)
GFR calc non Af Amer: >60 mL/min (ref >60)
Glucose, Bld: 89 mg/dL (ref 70–99)
Potassium: 4.1 mmol/L (ref 3.5–5.1)
Sodium: 138 mmol/L (ref 135–145)
Total Bilirubin: 0.8 mg/dL (ref 0.3–1.2)
Total Protein: 6.8 g/dL (ref 6.5–8.1)

## 2018-10-16 LAB — DIFFERENTIAL
Abs Immature Granulocytes: 0 10*3/uL (ref 0.00–0.07)
Basophils Absolute: 0 10*3/uL (ref 0.0–0.1)
Basophils Relative: 1 %
Eosinophils Absolute: 0 10*3/uL (ref 0.0–0.5)
Eosinophils Relative: 1 %
Immature Granulocytes: 0 %
Lymphocytes Relative: 26 %
Lymphs Abs: 1.1 10*3/uL (ref 0.7–4.0)
Monocytes Absolute: 0.4 10*3/uL (ref 0.1–1.0)
Monocytes Relative: 9 %
Neutro Abs: 2.6 10*3/uL (ref 1.7–7.7)
Neutrophils Relative %: 63 %

## 2018-10-16 LAB — URINALYSIS, ROUTINE W REFLEX MICROSCOPIC
Bacteria, UA: NONE SEEN
Bilirubin Urine: NEGATIVE
Glucose, UA: NEGATIVE mg/dL
Ketones, ur: NEGATIVE mg/dL
Leukocytes,Ua: NEGATIVE
Nitrite: NEGATIVE
Protein, ur: NEGATIVE mg/dL
Specific Gravity, Urine: 1.004 — ABNORMAL LOW (ref 1.005–1.030)
pH: 7 (ref 5.0–8.0)

## 2018-10-16 LAB — I-STAT CHEM 8, ED
BUN: 15 mg/dL (ref 8–23)
Calcium, Ion: 1.14 mmol/L — ABNORMAL LOW (ref 1.15–1.40)
Chloride: 105 mmol/L (ref 98–111)
Creatinine, Ser: 0.7 mg/dL (ref 0.44–1.00)
Glucose, Bld: 84 mg/dL (ref 70–99)
HCT: 43 % (ref 36.0–46.0)
Hemoglobin: 14.6 g/dL (ref 12.0–15.0)
Potassium: 4.1 mmol/L (ref 3.5–5.1)
Sodium: 139 mmol/L (ref 135–145)
TCO2: 25 mmol/L (ref 22–32)

## 2018-10-16 LAB — CBC
HCT: 43.4 % (ref 36.0–46.0)
Hemoglobin: 14.2 g/dL (ref 12.0–15.0)
MCH: 32 pg (ref 26.0–34.0)
MCHC: 32.7 g/dL (ref 30.0–36.0)
MCV: 97.7 fL (ref 80.0–100.0)
Platelets: 195 10*3/uL (ref 150–400)
RBC: 4.44 MIL/uL (ref 3.87–5.11)
RDW: 12.2 % (ref 11.5–15.5)
WBC: 4.1 10*3/uL (ref 4.0–10.5)
nRBC: 0 % (ref 0.0–0.2)

## 2018-10-16 LAB — RAPID URINE DRUG SCREEN, HOSP PERFORMED
Amphetamines: NOT DETECTED
Barbiturates: NOT DETECTED
Benzodiazepines: NOT DETECTED
Cocaine: NOT DETECTED
Opiates: NOT DETECTED
Tetrahydrocannabinol: NOT DETECTED

## 2018-10-16 LAB — CBG MONITORING, ED: Glucose-Capillary: 81 mg/dL (ref 70–99)

## 2018-10-16 LAB — MRSA PCR SCREENING: MRSA by PCR: NEGATIVE

## 2018-10-16 LAB — PROTIME-INR
INR: 1 (ref 0.8–1.2)
Prothrombin Time: 12.7 seconds (ref 11.4–15.2)

## 2018-10-16 LAB — ETHANOL: Alcohol, Ethyl (B): <10 mg/dL (ref <10)

## 2018-10-16 LAB — APTT: aPTT: 26 seconds (ref 24–36)

## 2018-10-16 LAB — SARS CORONAVIRUS 2 BY RT PCR (HOSPITAL ORDER, PERFORMED IN ~~LOC~~ HOSPITAL LAB): SARS Coronavirus 2: NEGATIVE

## 2018-10-16 MED ORDER — ACETAMINOPHEN 160 MG/5ML PO SOLN: 650.0000 mg | ORAL | Status: DC | PRN

## 2018-10-16 MED ORDER — ACETAMINOPHEN 650 MG RE SUPP: 650.0000 mg | RECTAL | Status: DC | PRN

## 2018-10-16 MED ORDER — STROKE: EARLY STAGES OF RECOVERY BOOK
Freq: Once | Status: DC
  Filled 2018-10-16: qty 1

## 2018-10-16 MED ORDER — SENNOSIDES-DOCUSATE SODIUM 8.6-50 MG PO TABS: 1.0000 | ORAL_TABLET | Freq: Every evening | ORAL | Status: DC | PRN

## 2018-10-16 MED ORDER — CLEVIDIPINE BUTYRATE 0.5 MG/ML IV EMUL: 0.0000 mg/h | INTRAVENOUS | Status: DC

## 2018-10-16 MED ORDER — LABETALOL HCL 5 MG/ML IV SOLN: 20.0000 mg | Freq: Once | INTRAVENOUS | Status: DC

## 2018-10-16 MED ORDER — IOHEXOL 350 MG/ML SOLN
50.0000 mL | Freq: Once | INTRAVENOUS | Status: AC | PRN
  Administered 2018-10-16: 50 mL via INTRAVENOUS

## 2018-10-16 MED ORDER — CHLORHEXIDINE GLUCONATE CLOTH 2 % EX PADS
6.0000 | MEDICATED_PAD | Freq: Every day | CUTANEOUS | Status: DC
  Administered 2018-10-16: 6 via TOPICAL

## 2018-10-16 MED ORDER — PANTOPRAZOLE SODIUM 40 MG IV SOLR
40.0000 mg | Freq: Every day | INTRAVENOUS | Status: DC
  Administered 2018-10-16: 40 mg via INTRAVENOUS
  Filled 2018-10-16: qty 40

## 2018-10-16 MED ORDER — ALTEPLASE (STROKE) FULL DOSE INFUSION
0.9000 mg/kg | Freq: Once | INTRAVENOUS | Status: AC
  Administered 2018-10-16: 15:00:00 64.9 mg via INTRAVENOUS
  Filled 2018-10-16: qty 100

## 2018-10-16 MED ORDER — SODIUM CHLORIDE 0.9 % IV SOLN
INTRAVENOUS | Status: DC
  Administered 2018-10-16 (×2): via INTRAVENOUS

## 2018-10-16 MED ORDER — SODIUM CHLORIDE 0.9 % IV SOLN
50.0000 mL | Freq: Once | INTRAVENOUS | Status: AC
  Administered 2018-10-16: 50 mL via INTRAVENOUS

## 2018-10-16 MED ORDER — ACETAMINOPHEN 325 MG PO TABS: 650.0000 mg | ORAL_TABLET | ORAL | Status: DC | PRN

## 2018-10-16 NOTE — Progress Notes (Signed)
PHARMACIST CODE STROKE RESPONSE  Notified to mix tPA at 1508 by Dr. Rory Percy Delivered tPA to RN at 1513 Bolus started at 1515  tPA dose = 6.5mg  bolus over 1 minute followed by 58.4mg  for a total dose of 64.9mg  over 1 hour  Issues/delays encountered (if applicable): Code stroke was not paged out until 1501; however, pharmacy aware prior to official page.   Brittany Osborn, PharmD PGY2 Cardiology Pharmacy Resident Please check AMION for all Pharmacist numbers by unit 10/16/2018 3:31 PM

## 2018-10-16 NOTE — H&P (Addendum)
Stroke history and physical-post TPA  CC: Left-sided weakness and numbness  History is obtained from: Patient  HPI: Brittany Osborn is a 73 y.o. female past medical history of headaches, hypertension, anxiety, in her usual state of health which she says was about an hour prior to presentation so around 1:45 PM when she had a sudden onset of cramping and weakness of the left leg followed by weakness of the left arm and numbness of the left side of the body. She reports that she had not been feeling well and was developing what she felt like was some swelling on the left lower extremity since yesterday but it was suddenly at 1:45 PM today that she had a sudden onset of this weakness and also numbness on the left side of the body. She has had multiple headaches recently, mainly frontal, diagnosed as tension headaches by her primary care team due to ongoing stressors-her husband has been pending hospice placement today, which has been off huge concern to her and been bothering her now for a while. She denies any preceding headaches prior to this acute change.  She said she had a headache yesterday but no headaches today.  She is never had similar symptoms in the past.  Denies any history of strokes.  Denies any current respiratory symptoms.  Denies any fevers chills.  Denies any preceding malaise, chills fevers appetite loss.  Denies abdominal pain nausea vomiting diarrhea constipation.  Denies double vision headaches currently.  Denies focal tingling.  Reports the numbness and weakness on the left side.   LKW: 1:45 PM tpa given?:  Yes Premorbid modified Rankin scale (mRS): 0 NIH stroke scale-3   Review of systems: ROS was performed and is negative except as noted in the HPI.    Past Medical History:  Diagnosis Date  . Anxiety   . Arthritis   . Cataracts, bilateral   . GERD (gastroesophageal reflux disease)   . H/O hiatal hernia   . Headache(784.0)     Family History  Problem Relation Age of  Onset  . Heart disease Mother   . Lung cancer Father     Social History:   reports that she has never smoked. She has never used smokeless tobacco. She reports that she does not drink alcohol or use drugs.  Medications  Current Facility-Administered Medications:  .   stroke: mapping our early stages of recovery book, , Does not apply, Once, Marliss Coots, PA-C .  alteplase (ACTIVASE) 1 mg/mL infusion 64.9 mg, 0.9 mg/kg, Intravenous, Once **FOLLOWED BY** 0.9 %  sodium chloride infusion, 50 mL, Intravenous, Once, Amie Portland, MD .  0.9 %  sodium chloride infusion, , Intravenous, Continuous, Marliss Coots, PA-C .  acetaminophen (TYLENOL) tablet 650 mg, 650 mg, Oral, Q4H PRN **OR** acetaminophen (TYLENOL) solution 650 mg, 650 mg, Per Tube, Q4H PRN **OR** acetaminophen (TYLENOL) suppository 650 mg, 650 mg, Rectal, Q4H PRN, Marliss Coots, PA-C .  labetalol (NORMODYNE) injection 20 mg, 20 mg, Intravenous, Once **AND** clevidipine (CLEVIPREX) infusion 0.5 mg/mL, 0-21 mg/hr, Intravenous, Continuous, Marliss Coots, PA-C .  pantoprazole (PROTONIX) injection 40 mg, 40 mg, Intravenous, QHS, Smith, David R, PA-C .  senna-docusate (Senokot-S) tablet 1 tablet, 1 tablet, Oral, QHS PRN, Marliss Coots, PA-C  Current Outpatient Medications:  .  aspirin EC 81 MG tablet, Take 81 mg by mouth at bedtime., Disp: , Rfl:   Exam: Current vital signs: BP (!) 144/57 (BP Location: Right Arm)   Pulse 66  Temp 98.8 F (37.1 C) (Oral)   Resp 18   Wt 72.1 kg   SpO2 99%   BMI 29.07 kg/m  Vital signs in last 24 hours: Temp:  [98.8 F (37.1 C)] 98.8 F (37.1 C) (05/19 1436) Pulse Rate:  [61-73] 66 (05/19 1515) Resp:  [18-20] 18 (05/19 1515) BP: (144-159)/(57-67) 144/57 (05/19 1515) SpO2:  [98 %-99 %] 99 % (05/19 1515) Weight:  [72.1 kg] 72.1 kg (05/19 1500) General: Awake alert in no distress HEENT: Normocephalic atraumatic Lungs: Clear to auscultation CVs: Respiratory regular rate rhythm Abdomen:  Soft nondistended nontender Extremities: Mild left lower extremity edema. Neurological exam Awake alert oriented x3 Speech is not dysarthric Naming comprehension repetition is intact. Fluency is intact Cranial nerves: Pupils equal round react light, extraocular movements intact, visual fields full, face is symmetric but facial sensation is decreased to temperature on the left and there was some inconsistent decreased vibration on the forehead on the right.  Tongue and palate midline. Motor exam: She is 4+/5 in the left upper extremity with no vertical drift.  She is 4/5 in the left lower extremity with drift-her leg hitting the bed after 5 seconds.  Right upper extremity 5/5 no drift.  Right lower extremity 4+/5 with no drift. Sensory exam: There is decreased sensation on the left side to temperature, light touch and vibration. Coordination: No dysmetria Gait testing was deferred at this time Labs I have reviewed labs in epic and the results pertinent to this consultation are:  CBC    Component Value Date/Time   WBC 4.1 10/16/2018 1448   RBC 4.44 10/16/2018 1448   HGB 14.6 10/16/2018 1458   HGB 15.3 04/02/2018 1556   HCT 43.0 10/16/2018 1458   HCT 44.2 04/02/2018 1556   PLT 195 10/16/2018 1448   PLT 245 04/02/2018 1556   MCV 97.7 10/16/2018 1448   MCV 92 04/02/2018 1556   MCH 32.0 10/16/2018 1448   MCHC 32.7 10/16/2018 1448   RDW 12.2 10/16/2018 1448   RDW 12.1 (L) 04/02/2018 1556   LYMPHSABS 1.1 10/16/2018 1448   LYMPHSABS 1.4 04/02/2018 1556   MONOABS 0.4 10/16/2018 1448   EOSABS 0.0 10/16/2018 1448   EOSABS 0.2 04/02/2018 1556   BASOSABS 0.0 10/16/2018 1448   BASOSABS 0.0 04/02/2018 1556    CMP     Component Value Date/Time   NA 139 10/16/2018 1458   NA 140 07/12/2018 1519   K 4.1 10/16/2018 1458   CL 105 10/16/2018 1458   CO2 24 10/16/2018 1448   GLUCOSE 84 10/16/2018 1458   BUN 15 10/16/2018 1458   BUN 18 07/12/2018 1519   CREATININE 0.70 10/16/2018 1458    CALCIUM 8.9 10/16/2018 1448   PROT 6.8 10/16/2018 1448   ALBUMIN 4.2 10/16/2018 1448   AST 21 10/16/2018 1448   ALT 18 10/16/2018 1448   ALKPHOS 53 10/16/2018 1448   BILITOT 0.8 10/16/2018 1448   GFRNONAA >60 10/16/2018 1448   GFRAA >60 10/16/2018 1448    Imaging I have reviewed the images obtained: CT-scan of the brain-no acute changes.  Formal radiology review with possible posterior right frontal lobe hypodensity that questionably might represent acute infarction.  Otherwise negative for bleed.  Aspects 10 CTA head and neck-negative for LVO on preliminary read.  Formal read pending.  Assessment: 73 year old woman past history of anxiety, hypertension, headaches, presenting with sudden onset of left-sided weakness and numbness with an NIH stroke scale of 3. Her predominant complaint was inability to walk due  to left leg weakness. She does have a history of anxiety, and headaches and is going through personal stressors-her husband is being placed in hospice due to advanced vascular dementia from strokes. Her presentation could represent a right hemispheric stroke but could also represent complex migraine or something nonorganic. I had a detailed discussion with her regarding focal neurological deficits that are acute in onset being treated with IV TPA and the risks and benefits of IV TPA for acute strokes.  She understood the risks and benefits and opted to go for IV TPA, to be able to get the best possible outcome in case this turns out to be a stroke.  Impression: Evaluate for acute ischemic stroke-status post TPA Evaluate for possible complex migraine Evaluate for strokelike symptoms   PLAN:  Admit to the neurological ICU  Neurological Post TPA vitals No antiplatelets or anticoagulants for 24 hours from IV TPA- until repeat imaging shows no evidence of bleed. Blood pressure goal systolic less than 741 per TPA protocol. MRI Echocardiogram A1c Lipid panel Hypoglycemia  management-use sliding scale insulin to maintain glucose between 1 40-1 80. Physical therapy Occupational Therapy Speech therapy N.p.o. until cleared by speech Close neuro monitoring  Hemiplegia and hemiparesis following cerebral infarction affecting left non-dominant side  -PT/OT -PM&R consult  RESP No acute changes Monitor clinically  CV Essential hypertension - Blood pressure parameters as above -Transthoracic echo  Hyperlipidemia, unspecified  - Statin for goal LDL < 70  HEME No active issues Monitor labs   ENDO No active issues Check A1c as part of risk factor work-up  GI/GU No active issues Gentle hydration  Fluid/Electrolyte Disorders No active issues Check labs and replete lites as necessary  ID Possible Aspiration PNA -CXR -NPO -Monitor   Prophylaxis DVT: SCDs GI: Protonix Bowel: Docusate/senna  Diet: NPO until cleared by bedside swallow eval  Code Status: Full Code   THE FOLLOWING WERE PRESENT ON ADMISSION:  Strokelike symptoms Left-side hemiparesis Possible aspiration pneumonia Essential hypertension  Delays in process: -Delayed notification and activation of code stroke due to inability of the ER to reach [facility]  -- Amie Portland, MD [facility] Pager: [phone] If 7pm to 7am, please call on call as listed on AMION.  CRITICAL CARE ATTESTATION Performed by: Amie Portland, MD Total critical care time: 45 minutes Critical care time was exclusive of separately billable procedures and treating other patients and/or supervising APPs/Residents/Students Critical care was necessary to treat or prevent imminent or life-threatening deterioration due to strokelike symptoms, possible acute ischemic stroke, TPA administration This patient is critically ill and at significant risk for neurological worsening and/or death and care requires constant monitoring. Critical care was time spent personally by me on the following  activities: development of treatment plan with patient and/or surrogate as well as nursing, discussions with consultants, evaluation of patient's response to treatment, examination of patient, obtaining history from patient or surrogate, ordering and performing treatments and interventions, ordering and review of laboratory studies, ordering and review of radiographic studies, pulse oximetry, re-evaluation of patient's condition, participation in multidisciplinary rounds and medical decision making of high complexity in the care of this patient.

## 2018-10-16 NOTE — Progress Notes (Signed)
Chaplain responded to consult from PRN chaplain/hospice chaplain Ardelle Anton. Patient's husband is in hospice care and Rev. Eulas Post has been attending to the family.  Patient's husband was moved to facility today.  Visited patient and provided ministry of listening and support.  Patient retold story of recent days as a caregiver. Her husband has advanced dementia and other issues. Chaplain assured her that [facility] would take good care of her husband who may be in his last days.  Chaplain offered prayer and presence.    Rev. Tamsen Snider Pager [phone]

## 2018-10-16 NOTE — Telephone Encounter (Signed)
Pt had called yesterday to obtain advice for spouse, she then added she was having some swelling of feet and head ache. She was advised to come to clinic for appt asap but stated she would prefer to wait and see dr Sherry Ruffing, appt was made. She was advised to rtc if she felt worse or had chest apin, short of breath, weakness of arms, legs, change in speech, vision. Today her son calls at appr 1315 to say pt cannot use her foot and that side of her body is numb in some places and swollen, hard to move. He is advised to call 911 and have pt brought to [employer]. Pt came on phone and she was also advised for 911. She states she wants to wait due to hospice coming to house today for her spouse but she is advised to call 911 now and come to ED, she is agreeable

## 2018-10-16 NOTE — ED Notes (Signed)
ED Provider at bedside. 

## 2018-10-16 NOTE — ED Triage Notes (Signed)
Pt arrives POV for eval of leg swelling, cramping and leg pain since last night. Pt reported to sort tech that she all her symptoms started last night. When this RN in to triage pt, she reports new L sided Leg numbness w/ subjectively slurred speech acute onset at [facility] 1200. Pt w/ no facial droop, obvious drift, pronator drift. Denies vision changes or HA.

## 2018-10-16 NOTE — Telephone Encounter (Signed)
Agree, she needs to go to the ED.

## 2018-10-16 NOTE — Code Documentation (Signed)
73 yo female coming from home where she lives with her husband. Pt came in POV after she noted a sudden onset of left sided numbness and weakness that started at 1200. Pt was at baseline when she woke up this morning and then reported her son came over to take her husband to hospice. While loading him up, she was unable to walk correctly and had to be helped to the car. Pt was brought back to CT to be evaluated. Code Stroke paged after patient was in CT for some time per staff. [facility] unable to be reached. Upon arrival to CT, pt noted to have an NIHSS 3 with left sided leg weakness and left sensory decreased. MD consented patient and she verbalized understanding of tPA. CT completed and showed no signs of hemorrhage. BP 145/58.  tPA started at 1515 - 6.5 mg bolus given over one minute with 59.4 mg given over 60 minutes. See Code Stroke log for detailed times and NIHSS. Admitted to [facility].

## 2018-10-16 NOTE — ED Provider Notes (Signed)
[facility] EMERGENCY DEPARTMENT Provider Note   CSN: [id number] Arrival date & time: 10/16/18  1418    History   Chief Complaint Chief Complaint  Patient presents with  . Code Stroke    HPI Brittany Osborn is a 73 y.o. female.  HPI 73 year old female with a history of headaches, HTN, and anxiety presents as a code stroke.  Patient states that around 1:45 PM this afternoon she had sudden onset of left leg pain described as cramping as well as weakness of both her left leg and arm.  She had associated numbness in the left side of her body.  She could not stand because of the weakness in her left lower extremity.  She reports that she has had some left lower extremity swelling.  Denies chest pain or shortness of breath.  No recent illnesses, fever, or cough.  Past Medical History:  Diagnosis Date  . Anxiety   . Arthritis   . Cataracts, bilateral   . GERD (gastroesophageal reflux disease)   . H/O hiatal hernia   . SWFUXNAT(557.3)     Patient Active Problem List   Diagnosis Date Noted  . Stroke ([place]) 10/16/2018  . Tension headache 07/12/2018  . Hypertension 04/03/2018  . Routine health maintenance 04/03/2018  . Varicose veins of left lower extremity 04/03/2018  . History of hiatal hernia, repaired 11/12/2012 11/29/2012    Past Surgical History:  Procedure Laterality Date  . CESAREAN SECTION    . COLONOSCOPY  09/16/2011   Procedure: COLONOSCOPY;  Surgeon: Beryle Beams, MD;  Location: [facility];  Service: Endoscopy;  Laterality: N/A;  . ESOPHAGOGASTRODUODENOSCOPY  09/16/2011   Procedure: ESOPHAGOGASTRODUODENOSCOPY (EGD);  Surgeon: Beryle Beams, MD;  Location: [facility] ENDOSCOPY;  Service: Endoscopy;  Laterality: N/A;  Per Dr. Benson Norway, pt wants do procedures at [facility] instead of [facility]  . LAPAROSCOPIC NISSEN FUNDOPLICATION N/A 07/19/2540   Procedure: LAPAROSCOPIC NISSEN FUNDOPLICATION laparoscopic hiatal hernia repair  ;  Surgeon: Shann Medal, MD;  Location: [facility] ORS;   Service: General;  Laterality: N/A;  . TONSILLECTOMY       OB History   No obstetric history on file.      Home Medications    Prior to Admission medications   Medication Sig Start Date End Date Taking? Authorizing Provider  aspirin EC 81 MG tablet Take 81 mg by mouth at bedtime.   Yes [provider]    Family History Family History  Problem Relation Age of Onset  . Heart disease Mother   . Lung cancer Father     Social History Social History   Tobacco Use  . Smoking status: Never Smoker  . Smokeless tobacco: Never Used  Substance Use Topics  . Alcohol use: No  . Drug use: No     Allergies   Strawberry extract; Sulfa drugs cross reactors; and Codeine   Review of Systems Review of Systems  Constitutional: Negative for chills and fever.  HENT: Negative for ear pain and sore throat.   Eyes: Negative for pain and visual disturbance.  Respiratory: Negative for cough and shortness of breath.   Cardiovascular: Negative for chest pain and palpitations.  Gastrointestinal: Negative for abdominal pain and vomiting.  Genitourinary: Negative for dysuria and hematuria.  Musculoskeletal: Negative for arthralgias and back pain.  Skin: Negative for color change and rash.  Neurological: Positive for weakness and numbness. Negative for seizures and syncope.  All other systems reviewed and are negative.    Physical Exam Updated  Vital Signs BP (!) 164/62   Pulse 71   Temp 98 F (36.7 C)   Resp 19   Wt 72.1 kg   SpO2 97%   BMI 29.07 kg/m   Physical Exam Vitals signs and nursing note reviewed.  Constitutional:      General: She is not in acute distress.    Appearance: She is well-developed.  HENT:     Head: Normocephalic and atraumatic.  Eyes:     Extraocular Movements: Extraocular movements intact.     Conjunctiva/sclera: Conjunctivae normal.     Pupils: Pupils are equal, round, and reactive to light.  Neck:     Musculoskeletal: Normal range of  motion and neck supple.  Cardiovascular:     Rate and Rhythm: Normal rate and regular rhythm.     Heart sounds: No murmur.  Pulmonary:     Effort: Pulmonary effort is normal. No respiratory distress.     Breath sounds: Normal breath sounds.  Abdominal:     Palpations: Abdomen is soft.     Tenderness: There is no abdominal tenderness.  Skin:    General: Skin is warm and dry.  Neurological:     Mental Status: She is alert.     Comments:  Mental Status:  Orientation: Alert and oriented to person, place, and time.  Memory: Cooperative, follows commands well. Recent and remote memory normal.  Attention, Concentration: Attention span and concentration are normal.  Language: Speech is clear and language is normal.  Fund of Knowledge: Aware of current events, vocabulary appropriate for patient age.  Cranial Nerves   II Visual Fields:  Intact to confrontation III, IV, VI:  Pupils equal and reactive to light and near. Full eye movement without nystagmus  V Facial Sensation:  Normal. No weakness of masticatory muscles  VII:  No facial weakness or asymmetry  VIII Auditory Acuity:  Grossly normal  IX/X:  The uvula is midline; the palate elevates symmetrically  XI:  Normal sternocleidomastoid and trapezius strength  XII:  The tongue is midline. No atrophy or fasciculations.    Motor System:  Muscle Strength: 4 /5 in left upper and lower extremity with drift of the lower extremity.  5/5 in the right upper and lower extremity Muscle Tone: Tone and muscle bulk are normal in the upper and lower extremities.   Sensation:  Decreased sensation of the left upper and lower extremity compared to right Gait:  Deferred        ED Treatments / Results  Labs (all labs ordered are listed, but only abnormal results are displayed) Labs Reviewed  URINALYSIS, ROUTINE W REFLEX MICROSCOPIC - Abnormal; Notable for the following components:      Result Value   Color, Urine STRAW (*)    Specific Gravity,  Urine 1.004 (*)    Hgb urine dipstick SMALL (*)    All other components within normal limits  I-STAT CHEM 8, ED - Abnormal; Notable for the following components:   Calcium, Ion 1.14 (*)    All other components within normal limits  SARS CORONAVIRUS 2 (HOSPITAL ORDER, [place] [facility])  MRSA PCR SCREENING  ETHANOL  PROTIME-INR  APTT  CBC  DIFFERENTIAL  COMPREHENSIVE METABOLIC PANEL  RAPID URINE DRUG SCREEN, HOSP PERFORMED  CBG MONITORING, ED    EKG EKG Interpretation  Date/Time:  Tuesday Oct 16 2018 14:40:40 EDT Ventricular Rate:  71 PR Interval:  170 QRS Duration: 70 QT Interval:  390 QTC Calculation: 423 R Axis:  75 Text Interpretation:  Normal sinus rhythm Normal ECG Confirmed by Virgel Manifold [phone]) on 10/16/2018 2:51:57 PM Also confirmed by Virgel Manifold [phone]), editor Hattie Perch [phone])  on 10/16/2018 3:09:07 PM   Radiology Ct Angio Head W Or Wo Contrast  Result Date: 10/16/2018 CLINICAL DATA:  LEFT-sided weakness. EXAM: CT ANGIOGRAPHY HEAD AND NECK TECHNIQUE: Multidetector CT imaging of the head and neck was performed using the standard protocol during bolus administration of intravenous contrast. Multiplanar CT image reconstructions and MIPs were obtained to evaluate the vascular anatomy. Carotid stenosis measurements (when applicable) are obtained utilizing NASCET criteria, using the distal internal carotid diameter as the denominator. CONTRAST:  73mL OMNIPAQUE IOHEXOL 350 MG/ML SOLN COMPARISON:  CT head earlier today. FINDINGS: CTA NECK FINDINGS Aortic arch: Standard branching. Imaged portion shows no evidence of aneurysm or dissection. No significant stenosis of the major arch vessel origins. Mild LEFT subclavian disease is nonstenotic. Aortic atherosclerosis. Right carotid system: No evidence of dissection, stenosis (50% or greater) or occlusion. Left carotid system: No evidence of dissection, stenosis (50% or greater) or occlusion.  Vertebral arteries: Codominant. No evidence of dissection, stenosis (50% or greater) or occlusion. Skeleton: Moderate spondylosis. No worrisome osseous finding. Other neck: No neck masses. Upper chest: Mild vascular congestion in the lungs. No pneumothorax. No mass. Review of the MIP images confirms the above findings CTA HEAD FINDINGS Anterior circulation: No significant stenosis, proximal occlusion, aneurysm, or vascular malformation. Posterior circulation: No significant stenosis, proximal occlusion, aneurysm, or vascular malformation. Venous sinuses: As permitted by contrast timing, patent. Anatomic variants: None of significance Delayed phase: Is not performed Review of the MIP images confirms the above findings IMPRESSION: No intracranial or extracranial stenosis of significance. Electronically Signed   By: Staci Righter M.D.   On: 10/16/2018 15:47   Ct Angio Neck W Or Wo Contrast  Result Date: 10/16/2018 CLINICAL DATA:  LEFT-sided weakness. EXAM: CT ANGIOGRAPHY HEAD AND NECK TECHNIQUE: Multidetector CT imaging of the head and neck was performed using the standard protocol during bolus administration of intravenous contrast. Multiplanar CT image reconstructions and MIPs were obtained to evaluate the vascular anatomy. Carotid stenosis measurements (when applicable) are obtained utilizing NASCET criteria, using the distal internal carotid diameter as the denominator. CONTRAST:  19mL OMNIPAQUE IOHEXOL 350 MG/ML SOLN COMPARISON:  CT head earlier today. FINDINGS: CTA NECK FINDINGS Aortic arch: Standard branching. Imaged portion shows no evidence of aneurysm or dissection. No significant stenosis of the major arch vessel origins. Mild LEFT subclavian disease is nonstenotic. Aortic atherosclerosis. Right carotid system: No evidence of dissection, stenosis (50% or greater) or occlusion. Left carotid system: No evidence of dissection, stenosis (50% or greater) or occlusion. Vertebral arteries: Codominant. No  evidence of dissection, stenosis (50% or greater) or occlusion. Skeleton: Moderate spondylosis. No worrisome osseous finding. Other neck: No neck masses. Upper chest: Mild vascular congestion in the lungs. No pneumothorax. No mass. Review of the MIP images confirms the above findings CTA HEAD FINDINGS Anterior circulation: No significant stenosis, proximal occlusion, aneurysm, or vascular malformation. Posterior circulation: No significant stenosis, proximal occlusion, aneurysm, or vascular malformation. Venous sinuses: As permitted by contrast timing, patent. Anatomic variants: None of significance Delayed phase: Is not performed Review of the MIP images confirms the above findings IMPRESSION: No intracranial or extracranial stenosis of significance. Electronically Signed   By: Staci Righter M.D.   On: 10/16/2018 15:47   Ct Head Code Stroke Wo Contrast  Result Date: 10/16/2018 CLINICAL DATA:  Code stroke.  Left-sided weakness EXAM: CT  HEAD WITHOUT CONTRAST TECHNIQUE: Contiguous axial images were obtained from the base of the skull through the vertex without intravenous contrast. COMPARISON:  None. FINDINGS: Brain: Subtle hypodensity in the right posterior frontal white matter. This may represent acute infarction. Otherwise no acute infarct, hemorrhage, mass. Ventricle size is normal. Vascular: Negative for hyperdense vessel Skull: Negative Sinuses/Orbits: Mucoperiosteal thickening right maxillary sinus. Remaining sinuses clear. Bilateral cataract surgery. Other: None ASPECTS ([place] Stroke Program Early CT Score) - Ganglionic level infarction (caudate, lentiform nuclei, internal capsule, insula, M1-M3 cortex): 7 - Supraganglionic infarction (M4-M6 cortex): 3 Total score (0-10 with 10 being normal): 10 IMPRESSION: 1. Hypodensity right posterior frontal lobe may represent acute infarct. This appears to be predominantly in the white matter therefore no points adducted on aspect score. 2. ASPECTS is 10  Electronically Signed   By: Franchot Gallo M.D.   On: 10/16/2018 15:04    Procedures Procedures (including critical care time)  Medications Ordered in ED Medications   stroke: mapping our early stages of recovery book (has no administration in time range)  0.9 %  sodium chloride infusion (has no administration in time range)  acetaminophen (TYLENOL) tablet 650 mg (has no administration in time range)    Or  acetaminophen (TYLENOL) solution 650 mg (has no administration in time range)    Or  acetaminophen (TYLENOL) suppository 650 mg (has no administration in time range)  senna-docusate (Senokot-S) tablet 1 tablet (has no administration in time range)  pantoprazole (PROTONIX) injection 40 mg (has no administration in time range)  labetalol (NORMODYNE) injection 20 mg (has no administration in time range)    And  clevidipine (CLEVIPREX) infusion 0.5 mg/mL (has no administration in time range)  Chlorhexidine Gluconate Cloth 2 % PADS 6 each (has no administration in time range)  alteplase (ACTIVASE) 1 mg/mL infusion 64.9 mg (64.9 mg Intravenous New Bag/Given 10/16/18 1515)    Followed by  0.9 %  sodium chloride infusion (50 mLs Intravenous New Bag/Given 10/16/18 1611)  iohexol (OMNIPAQUE) 350 MG/ML injection 50 mL (50 mLs Intravenous Contrast Given 10/16/18 1511)     Initial Impression / Assessment and Plan / ED Course  I have reviewed the triage vital signs and the nursing notes.  Pertinent labs & imaging results that were available during my care of the patient were reviewed by me and considered in my medical decision making (see chart for details).  73 year old female with a history of headaches, HTN, and anxiety presents as a code stroke.  Patient states that around 1:45 PM this afternoon she had sudden onset of left leg pain described as cramping as well as weakness of both her left leg and arm.  Patient was quickly evaluated by neurology.  4/5 strength in the left upper and lower  extremity with drift of her left leg.  CT head shows hypodensity in the right posterior frontal lobe that may represent an acute infarct.  TPA given.  Patient ddmitted to the neurological ICU for further management.  Final Clinical Impressions(s) / ED Diagnoses   Final diagnoses:  Left-sided weakness    ED Discharge Orders    None       Trinidad Curet, MD 10/16/18 1624    Julianne Rice, MD 10/16/18 1816

## 2018-10-16 NOTE — Progress Notes (Signed)
1630 paged stroke response nurse regarding new facial droop. No other neuro changes. Dr. Rory Percy was with her and message was relayed. No new orders at this time. Will continue frequent neuro checks per orders.

## 2018-10-16 NOTE — ED Notes (Signed)
Gum bleeding noted. Dr. Alanson Puls  Aware.

## 2018-10-17 ENCOUNTER — Inpatient Hospital Stay (HOSPITAL_COMMUNITY): Payer: Medicare Other

## 2018-10-17 DIAGNOSIS — E785 Hyperlipidemia, unspecified: Secondary | ICD-10-CM

## 2018-10-17 DIAGNOSIS — R531 Weakness: Secondary | ICD-10-CM

## 2018-10-17 DIAGNOSIS — F419 Anxiety disorder, unspecified: Secondary | ICD-10-CM

## 2018-10-17 DIAGNOSIS — I371 Nonrheumatic pulmonary valve insufficiency: Secondary | ICD-10-CM

## 2018-10-17 DIAGNOSIS — G459 Transient cerebral ischemic attack, unspecified: Principal | ICD-10-CM

## 2018-10-17 LAB — ECHOCARDIOGRAM COMPLETE: Weight: 2543.23 oz

## 2018-10-17 LAB — LIPID PANEL
Cholesterol: 162 mg/dL (ref 0–200)
HDL: 49 mg/dL (ref >40)
LDL Cholesterol: 101 mg/dL — ABNORMAL HIGH (ref 0–99)
Total CHOL/HDL Ratio: 3.3 RATIO
Triglycerides: 59 mg/dL (ref <150)
VLDL: 12 mg/dL (ref 0–40)

## 2018-10-17 LAB — HEMOGLOBIN A1C
Hgb A1c MFr Bld: 5.2 % (ref 4.8–5.6)
Mean Plasma Glucose: 102.54 mg/dL

## 2018-10-17 MED ORDER — ATORVASTATIN CALCIUM 10 MG PO TABS
20.0000 mg | ORAL_TABLET | Freq: Every day | ORAL | Status: DC
  Administered 2018-10-17: 20 mg via ORAL
  Filled 2018-10-17: qty 2

## 2018-10-17 MED ORDER — ASPIRIN EC 81 MG PO TBEC
81.0000 mg | DELAYED_RELEASE_TABLET | Freq: Every day | ORAL | Status: DC
  Administered 2018-10-17: 21:00:00 81 mg via ORAL
  Filled 2018-10-17: qty 1

## 2018-10-17 MED ORDER — PANTOPRAZOLE SODIUM 40 MG PO TBEC
40.0000 mg | DELAYED_RELEASE_TABLET | Freq: Every day | ORAL | Status: DC
  Administered 2018-10-17 – 2018-10-18 (×2): 40 mg via ORAL
  Filled 2018-10-17 (×2): qty 1

## 2018-10-17 MED ORDER — LABETALOL HCL 5 MG/ML IV SOLN: 5.0000 mg | INTRAVENOUS | Status: DC | PRN

## 2018-10-17 MED ORDER — CLOPIDOGREL BISULFATE 75 MG PO TABS
75.0000 mg | ORAL_TABLET | Freq: Every day | ORAL | Status: DC
  Administered 2018-10-17: 21:00:00 75 mg via ORAL
  Filled 2018-10-17: qty 1

## 2018-10-17 NOTE — Evaluation (Signed)
Speech Language Pathology Evaluation Patient Details Name: Brittany Osborn MRN: [id number] DOB: 06-27-1945 Today's Date: 10/17/2018 Time: 6834-1962 SLP Time Calculation (min) (ACUTE ONLY): 17 min  Problem List:  Patient Active Problem List   Diagnosis Date Noted  . Stroke ([place]) 10/16/2018  . Tension headache 07/12/2018  . Hypertension 04/03/2018  . Routine health maintenance 04/03/2018  . Varicose veins of left lower extremity 04/03/2018  . History of hiatal hernia, repaired 11/12/2012 11/29/2012   Past Medical History:  Past Medical History:  Diagnosis Date  . Anxiety   . Arthritis   . Cataracts, bilateral   . GERD (gastroesophageal reflux disease)   . H/O hiatal hernia   . IWLNLGXQ(119.4)    Past Surgical History:  Past Surgical History:  Procedure Laterality Date  . CESAREAN SECTION    . COLONOSCOPY  09/16/2011   Procedure: COLONOSCOPY;  Surgeon: Beryle Beams, MD;  Location: [facility];  Service: Endoscopy;  Laterality: N/A;  . ESOPHAGOGASTRODUODENOSCOPY  09/16/2011   Procedure: ESOPHAGOGASTRODUODENOSCOPY (EGD);  Surgeon: Beryle Beams, MD;  Location: [facility] ENDOSCOPY;  Service: Endoscopy;  Laterality: N/A;  Per Dr. Benson Norway, pt wants do procedures at [facility] instead of [facility]  . LAPAROSCOPIC NISSEN FUNDOPLICATION N/A 1/74/0814   Procedure: LAPAROSCOPIC NISSEN FUNDOPLICATION laparoscopic hiatal hernia repair  ;  Surgeon: Shann Medal, MD;  Location: [facility] ORS;  Service: General;  Laterality: N/A;  . TONSILLECTOMY     HPI:  Brittany Osborn is a 73 y.o. female past medical history of GERD, hiatal hernia with Nissen fundoplication, headaches, hypertension, anxiety admitted with sudden onset of cramping and weakness of the left leg, weakness of the left arm and numbness of the left side of the body. CT ypodensity right posterior frontal lobe may represent acute infarct. MRI pending.    Assessment / Plan / Recommendation Clinical Impression  Pt does not report problems with cognition or speech  but visual disturbance. She reports having stressful home life and has been caring for husband at home who now is in hospice care. Speech 100% clear and intelligible. No cognitive or language impairments identified and scored a 29/30 on MOCA (26 > normal).  No ST needs at this time.     SLP Assessment  SLP Recommendation/Assessment: Patient does not need any further Speech Lanaguage Pathology Services SLP Visit Diagnosis: Cognitive communication deficit (R41.841)    Follow Up Recommendations  None    Frequency and Duration           SLP Evaluation Cognition  Overall Cognitive Status: Impaired/Different from baseline       Comprehension  Auditory Comprehension Overall Auditory Comprehension: Appears within functional limits for tasks assessed Reading Comprehension Reading Status: Not tested    Expression Expression Primary Mode of Expression: Verbal Verbal Expression Overall Verbal Expression: Appears within functional limits for tasks assessed Written Expression Dominant Hand: Right Written Expression: Not tested   Oral / Motor  Oral Motor/Sensory Function Overall Oral Motor/Sensory Function: Mild impairment(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

## 2018-10-17 NOTE — Progress Notes (Signed)
  Echocardiogram 2D Echocardiogram has been performed.  Jennette Dubin 10/17/2018, 9:16 AM

## 2018-10-17 NOTE — Evaluation (Signed)
Occupational Therapy Evaluation Patient Details Name: VRINDA HECKSTALL MRN: [id number] DOB: [birth year] Today's Date: 10/17/2018    History of Present Illness Pt is a 73 y.o. F with significant PMH of headaches, hypertension, anxiety who was admitted with suddent cramping and weakness of left leg, weakness of left arm and numbness on left side of body. CT hypodensity right posterior frontal lobe may represent acute infarct, MRI pending. tPA given 5/19.   Clinical Impression   PTA, pt was living alone with her husband recent transferring to hospice; pt was independent and caring for her husband. Pt currently requiring Min Guard A-Min A for LB ADLs, toileting, and functional mobility. Pt presenting with decreased functional use of LUE, balance, and cognition. Pt requiring increased time and cues throughout session and she reports "I need more time than normal." Pt would benefit from further acute OT to facilitate safe dc. Recommend dc to home with HHOT for further OT to optimize safety, independence with ADLs, and return to PLOF.      Follow Up Recommendations  Home health OT;Supervision/Assistance - 24 hour    Equipment Recommendations  Tub/shower seat    Recommendations for Other Services PT consult;Speech consult     Precautions / Restrictions Precautions Precautions: Fall Restrictions Weight Bearing Restrictions: No      Mobility Bed Mobility Overal bed mobility: Modified Independent             General bed mobility comments: Significant time and use of bed rails.   Transfers Overall transfer level: Needs assistance Equipment used: None Transfers: Sit to/from Stand Sit to Stand: Min guard         General transfer comment: Min guard for safety    Balance Overall balance assessment: Needs assistance Sitting-balance support: Feet supported Sitting balance-Leahy Scale: Good     Standing balance support: No upper extremity supported;During functional  activity Standing balance-Leahy Scale: Fair                             ADL either performed or assessed with clinical judgement   ADL Overall ADL's : Needs assistance/impaired Eating/Feeding: Set up;Supervision/ safety;Sitting   Grooming: Wash/dry hands;Min guard;Standing Grooming Details (indicate cue type and reason): Noting posterior lean and requiring Min guard A for safety Upper Body Bathing: Min guard;Sitting   Lower Body Bathing: Minimal assistance;Sit to/from stand   Upper Body Dressing : Min guard;Sitting   Lower Body Dressing: Minimal assistance;Sit to/from stand   Toilet Transfer: Min guard;Ambulation;Minimal assistance;Grab Information systems manager Details (indicate cue type and reason): Single hand held A for balance. Min Guard A for safety during descent Toileting- Water quality scientist and Hygiene: Teacher, music Details (indicate cue type and reason): Min Guard A for safety during peri care     Functional mobility during ADLs: Minimal assistance;Min guard General ADL Comments: Pt presenting with decreased balance, cognition, strength, and functional use of LUE     Vision Baseline Vision/History: Wears glasses Wears Glasses: Reading only;Distance only(wears different glasses for near and far) Patient Visual Report: Other (comment)(Reported "the lines moving" using MOCA with SLP) Vision Assessment?: Yes Eye Alignment: Within Functional Limits Ocular Range of Motion: Within Functional Limits Alignment/Gaze Preference: Within Defined Limits Tracking/Visual Pursuits: Able to track stimulus in all quads without difficulty Convergence: Within functional limits Visual Fields: No apparent deficits Additional Comments: Pt able to track to all visual fields. Denies diplopia. Drawing a clock demonstrating WFL in writting  Perception     Praxis      Pertinent Vitals/Pain Pain Assessment: No/denies pain      Hand Dominance Right   Extremity/Trunk Assessment Upper Extremity Assessment Upper Extremity Assessment: LUE deficits/detail LUE Deficits / Details: Pt presenting with decreased strength and coordination of LUE. Pt reqiring increased cues for finger opposition.  LUE Coordination: decreased fine motor   Lower Extremity Assessment Lower Extremity Assessment: Defer to PT evaluation RLE Deficits / Details: Strength 5/5 RLE Sensation: WNL LLE Deficits / Details: MMT: hip flexion 4/5, knee extension 4/5, ankle dorsiflexion 5/5 LLE Sensation: WNL   Cervical / Trunk Assessment Cervical / Trunk Assessment: Normal   Communication Communication Communication: No difficulties   Cognition Arousal/Alertness: Awake/alert Behavior During Therapy: WFL for tasks assessed/performed Overall Cognitive Status: Impaired/Different from baseline Area of Impairment: Problem solving;Awareness                           Awareness: Emergent Problem Solving: Slow processing;Requires verbal cues General Comments: Pt reporting she feels like she has slow processing. Pt requiring increased time and cues throughout session. Pt drawing a clock and placing a five down twice (on either side of the six). Pt requiring three cues for identifying error.    General Comments  VSS.    Exercises     Shoulder Instructions      Home Living Family/patient expects to be discharged to:: Private residence Living Arrangements: Children Available Help at Discharge: Family;Available 24 hours/day Type of Home: House Home Access: Stairs to enter [employer] of Steps: 3 Entrance Stairs-Rails: Right Home Layout: One level     Bathroom Shower/Tub: Occupational psychologist: Standard     Home Equipment: Grab bars - tub/shower;Grab bars - toilet;Walker - 2 wheels   Additional Comments: Plans to stay at her daughter's home at dc. Son planning on staying with patient once she is ready to  return to her home; her son works during day      Prior Functioning/Environment Level of Independence: Independent        Comments: Pt husband recently went to hospice        OT Problem List: Decreased strength;Decreased range of motion;Decreased activity tolerance;Impaired balance (sitting and/or standing);Decreased safety awareness;Decreased knowledge of use of DME or AE;Decreased cognition;Decreased knowledge of precautions;Impaired UE functional use      OT Treatment/Interventions: Self-care/ADL training;Therapeutic exercise;Energy conservation;DME and/or AE instruction;Therapeutic activities;Patient/family education    OT Goals(Current goals can be found in the care plan section) Acute Rehab OT Goals Patient Stated Goal: "focus on my health right now." OT Goal Formulation: With patient Time For Goal Achievement: 10/31/18 Potential to Achieve Goals: Good  OT Frequency: Min 3X/week   Barriers to D/C:            Co-evaluation PT/OT/SLP Co-Evaluation/Treatment: Yes Reason for Co-Treatment: For patient/therapist safety;To address functional/ADL transfers PT goals addressed during session: Mobility/safety with mobility OT goals addressed during session: ADL's and self-care      AM-PAC OT "6 Clicks" Daily Activity     Outcome Measure Help from another person eating meals?: None Help from another person taking care of personal grooming?: A Little Help from another person toileting, which includes using toliet, bedpan, or urinal?: A Little Help from another person bathing (including washing, rinsing, drying)?: A Little Help from another person to put on and taking off regular upper body clothing?: A Little Help from another person to put on and taking off  regular lower body clothing?: A Little 6 Click Score: 19   End of Session Equipment Utilized During Treatment: Gait belt Nurse Communication: Mobility status  Activity Tolerance: Patient tolerated treatment  well Patient left: in chair;with call bell/phone within reach;with chair alarm set  OT Visit Diagnosis: Unsteadiness on feet (R26.81);Other abnormalities of gait and mobility (R26.89);Muscle weakness (generalized) (M62.81);Other symptoms and signs involving cognitive function;Hemiplegia and hemiparesis Hemiplegia - Right/Left: Left Hemiplegia - dominant/non-dominant: Non-Dominant Hemiplegia - caused by: Cerebral infarction                Time: 1050-1120 OT Time Calculation (min): 30 min Charges:  OT General Charges $OT Visit: 1 Visit OT Evaluation $OT Eval Moderate Complexity: 1 Mod    MSOT, OTR/L Acute Rehab Pager: [phone] Office: [place] 10/17/2018, 1:45 PM

## 2018-10-17 NOTE — Progress Notes (Signed)
I responded to a request from the On-Call Chaplain to provide a follow-up visit. I visited the patient's room, shared words of encouragement, and led in prayer. I shared that the Chaplain is available to provide additional support as needed or requested.    10/17/18 1150  Clinical Encounter Type  Visited With Patient  Visit Type Follow-up;Spiritual support  Referral From Nurse  Consult/Referral To Chaplain  Spiritual Encounters  Spiritual Needs Prayer    Chaplain Dr Redgie Grayer

## 2018-10-17 NOTE — Evaluation (Signed)
Physical Therapy Evaluation Patient Details Name: Brittany Osborn MRN: [id number] DOB: June 06, 1945 Today's Date: 10/17/2018   History of Present Illness  Pt is a 73 y.o. F with significant PMH of headaches, hypertension, anxiety who was admitted with suddent cramping and weakness of left leg, weakness of left arm and numbness on left side of body. CT hypodensity right posterior frontal lobe may represent acute infarct, MRI pending. tPA given 5/19.  Clinical Impression  Pt admitted with above diagnosis. Pt currently with functional limitations due to the deficits listed below (see PT Problem List). Pt presenting with decreased functional mobility secondary to slight left lower extremity weakness, gait abnormalities, decreased gait speed, balance impairments, and slow processing. Pt ambulating x 50 feet with no assistive device and min guard assist. Pt stating, "I feel like I'm walking on cotton." Sensation and proprioception normal upon assessment. Suspect pt will progress well but do recommend increased supervision/assist initially and HHPT to maximize functional independence.      Follow Up Recommendations Home health PT;Supervision for mobility/OOB    Equipment Recommendations  None recommended by PT    Recommendations for Other Services       Precautions / Restrictions Precautions Precautions: Fall Restrictions Weight Bearing Restrictions: No      Mobility  Bed Mobility Overal bed mobility: Modified Independent             General bed mobility comments: Increased time, use of bed rails  Transfers Overall transfer level: Needs assistance Equipment used: None Transfers: Sit to/from Stand Sit to Stand: Min guard         General transfer comment: Min guard for safety  Ambulation/Gait Ambulation/Gait assistance: Min guard Gait Distance (Feet): 50 Feet Assistive device: None;1 person hand held assist Gait Pattern/deviations: Step-through pattern;Decreased stride  length;Narrow base of support Gait velocity: decreased Gait velocity interpretation: <1.8 ft/sec, indicate of risk for recurrent falls General Gait Details: Very guarded, slow gait pattern with bilateral scapular elevation. Requiring up to min guard for stability  Stairs            Wheelchair Mobility    Modified Rankin (Stroke Patients Only) Modified Rankin (Stroke Patients Only) Pre-Morbid Rankin Score: No symptoms Modified Rankin: Moderately severe disability     Balance Overall balance assessment: Needs assistance Sitting-balance support: Feet supported Sitting balance-Leahy Scale: Good     Standing balance support: No upper extremity supported;During functional activity Standing balance-Leahy Scale: Fair                               Pertinent Vitals/Pain Pain Assessment: No/denies pain    Home Living Family/patient expects to be discharged to:: Private residence Living Arrangements: Children Available Help at Discharge: Family;Available 24 hours/day Type of Home: House Home Access: Stairs to enter Entrance Stairs-Rails: Right Entrance Stairs-Number of Steps: 3 Home Layout: One level Home Equipment: Grab bars - tub/shower;Grab bars - toilet;Walker - 2 wheels Additional Comments: Will live at her house with her son and when he is working, will stay at her daughter's house    Prior Function Level of Independence: Independent         Comments: Pt husband recently went to hospice     Hand Dominance   Dominant Hand: Right    Extremity/Trunk Assessment   Upper Extremity Assessment Upper Extremity Assessment: Defer to OT evaluation    Lower Extremity Assessment Lower Extremity Assessment: RLE deficits/detail;LLE deficits/detail RLE Deficits / Details: Strength 5/5 RLE  Sensation: WNL LLE Deficits / Details: MMT: hip flexion 4/5, knee extension 4/5, ankle dorsiflexion 5/5 LLE Sensation: WNL       Communication   Communication: No  difficulties  Cognition Arousal/Alertness: Awake/alert Behavior During Therapy: WFL for tasks assessed/performed Overall Cognitive Status: Impaired/Different from baseline Area of Impairment: Problem solving                             Problem Solving: Slow processing;Requires verbal cues        General Comments      Exercises     Assessment/Plan    PT Assessment Patient needs continued PT services  PT Problem List Decreased strength;Decreased balance;Decreased mobility       PT Treatment Interventions DME instruction;Gait training;Functional mobility training;Stair training;Therapeutic activities;Therapeutic exercise;Balance training;Patient/family education;Neuromuscular re-education    PT Goals (Current goals can be found in the Care Plan section)  Acute Rehab PT Goals Patient Stated Goal: "focus on my health right now." PT Goal Formulation: With patient Time For Goal Achievement: 10/31/18 Potential to Achieve Goals: Good    Frequency Min 4X/week   Barriers to discharge        Co-evaluation PT/OT/SLP Co-Evaluation/Treatment: Yes Reason for Co-Treatment: To address functional/ADL transfers PT goals addressed during session: Mobility/safety with mobility         AM-PAC PT "6 Clicks" Mobility  Outcome Measure Help needed turning from your back to your side while in a flat bed without using bedrails?: None Help needed moving from lying on your back to sitting on the side of a flat bed without using bedrails?: A Little Help needed moving to and from a bed to a chair (including a wheelchair)?: A Little Help needed standing up from a chair using your arms (e.g., wheelchair or bedside chair)?: A Little Help needed to walk in hospital room?: A Little Help needed climbing 3-5 steps with a railing? : A Lot 6 Click Score: 18    End of Session Equipment Utilized During Treatment: Gait belt Activity Tolerance: Patient tolerated treatment well Patient left:  in chair;with call bell/phone within reach;with chair alarm set Nurse Communication: Mobility status PT Visit Diagnosis: Unsteadiness on feet (R26.81);Difficulty in walking, not elsewhere classified (R26.2)    Time: 1050-1120 PT Time Calculation (min) (ACUTE ONLY): 30 min   Charges:   PT Evaluation $PT Eval Moderate Complexity: 1 Mod          Ellamae Sia, [place], DPT Acute Rehabilitation Services Pager [phone] Office [phone]   Willy Eddy 10/17/2018, 11:34 AM

## 2018-10-17 NOTE — Progress Notes (Signed)
Occupational Therapy Treatment Patient Details Name: Brittany Osborn MRN: [id number] DOB: 07-Aug-1945 Today's Date: 10/17/2018    History of present illness Pt is a 73 y.o. F with significant PMH of headaches, hypertension, anxiety who was admitted with suddent cramping and weakness of left leg, weakness of left arm and numbness on left side of body. CT hypodensity right posterior frontal lobe may represent acute infarct, MRI pending. tPA given 5/19.   OT comments  Pt seen for 2nd session as pt was calling out in hallway wanting to go to restroom. Pt on OT caseload so pt seen for OT session for visual scanning and executive functioning. Pt requiring assist to open toothpaste due to LUE weakness. Pt using RW for stability. Pt tolerating session well with decreased visual scanning in L lower quadrant today. Pt would benefit from continued OT skilled services for ADL, mobility and safety in [place] setting. OT to follow acutely.    Follow Up Recommendations  Home health OT;Supervision/Assistance - 24 hour    Equipment Recommendations  Tub/shower seat    Recommendations for Other Services PT consult;Speech consult    Precautions / Restrictions Precautions Precautions: Fall Restrictions Weight Bearing Restrictions: No       Mobility Bed Mobility Overal bed mobility: Modified Independent             General bed mobility comments: Significant time and use of bed rails.   Transfers Overall transfer level: Needs assistance Equipment used: None Transfers: Sit to/from Stand Sit to Stand: Min guard         General transfer comment: Min guard for safety    Balance Overall balance assessment: Needs assistance Sitting-balance support: Feet supported Sitting balance-Leahy Scale: Good     Standing balance support: Bilateral upper extremity supported Standing balance-Leahy Scale: Fair                             ADL either performed or assessed with clinical judgement    ADL Overall ADL's : Needs assistance/impaired Eating/Feeding: Set up;Supervision/ safety;Sitting   Grooming: Wash/dry hands;Wash/dry face;Oral care;Brushing hair;Min guard;Standing Grooming Details (indicate cue type and reason): Noting posterior lean and requiring Min guard A for safety Upper Body Bathing: Min guard;Sitting   Lower Body Bathing: Minimal assistance;Sit to/from stand   Upper Body Dressing : Min guard;Sitting   Lower Body Dressing: Minimal assistance;Sit to/from stand   Toilet Transfer: Loss adjuster, chartered Details (indicate cue type and reason): Single hand held A for balance. Min Guard A for safety during descent Toileting- Water quality scientist and Hygiene: Teacher, music Details (indicate cue type and reason): Min Guard A for safety during peri care     Functional mobility during ADLs: Min guard;Cueing for safety;Cueing for sequencing;Rolling walker General ADL Comments: Pt presenting with decreased balance, cognition, strength, and functional use of LUE     Vision Baseline Vision/History: Wears glasses Wears Glasses: Reading only;Distance only(wears different glasses for near and far) Patient Visual Report: Other (comment)(Reported "the lines moving" using MOCA with SLP) Vision Assessment?: Yes Eye Alignment: Within Functional Limits Ocular Range of Motion: Within Functional Limits Alignment/Gaze Preference: Within Defined Limits Tracking/Visual Pursuits: Able to track stimulus in all quads without difficulty Convergence: Within functional limits Visual Fields: No apparent deficits Additional Comments: Scanning and peripheral visionWFLs. Pt scanning in L lower quadrant saw only 2 digits instead of 3.   Perception     Praxis  Cognition Arousal/Alertness: Awake/alert Behavior During Therapy: WFL for tasks assessed/performed Overall Cognitive Status: Impaired/Different from baseline Area of  Impairment: Problem solving;Awareness                           Awareness: Emergent Problem Solving: Slow processing;Requires verbal cues General Comments: Pt asking a lot of questions regarding ADL tasks and regarding her stroke.        Exercises     Shoulder Instructions       General Comments VSS.    Pertinent Vitals/ Pain       Pain Assessment: No/denies pain  Home Living Family/patient expects to be discharged to:: Private residence Living Arrangements: Children Available Help at Discharge: Family;Available 24 hours/day Type of Home: House Home Access: Stairs to enter [employer] of Steps: 3 Entrance Stairs-Rails: Right Home Layout: One level     Bathroom Shower/Tub: Occupational psychologist: Standard     Home Equipment: Grab bars - tub/shower;Grab bars - toilet;Walker - 2 wheels   Additional Comments: Plans to stay at her daughter's home at dc. Son planning on staying with patient once she is ready to return to her home; her son works during day      Prior Functioning/Environment Level of Independence: Independent        Comments: Pt husband recently went to hospice   Frequency  Min 3X/week        Progress Toward Goals  OT Goals(current goals can now be found in the care plan section)  Progress towards OT goals: Progressing toward goals  Acute Rehab OT Goals Patient Stated Goal: "focus on my health right now." OT Goal Formulation: With patient Time For Goal Achievement: 10/31/18 Potential to Achieve Goals: Good ADL Goals Pt Will Perform Grooming: with modified independence;standing Pt Will Perform Lower Body Dressing: with modified independence;sit to/from stand Pt Will Transfer to Toilet: with modified independence;ambulating;regular height toilet Pt Will Perform Toileting - Clothing Manipulation and hygiene: with modified independence;sit to/from stand;sitting/lateral leans Pt/caregiver will Perform Home  Exercise Program: Increased ROM;Increased strength;With Supervision;Left upper extremity  Plan Discharge plan remains appropriate    Co-evaluation    PT/OT/SLP Co-Evaluation/Treatment: Yes Reason for Co-Treatment: For patient/therapist safety;To address functional/ADL transfers   OT goals addressed during session: ADL's and self-care      AM-PAC OT "6 Clicks" Daily Activity     Outcome Measure   Help from another person eating meals?: None Help from another person taking care of personal grooming?: A Little Help from another person toileting, which includes using toliet, bedpan, or urinal?: A Little Help from another person bathing (including washing, rinsing, drying)?: A Little Help from another person to put on and taking off regular upper body clothing?: A Little Help from another person to put on and taking off regular lower body clothing?: A Little 6 Click Score: 19    End of Session Equipment Utilized During Treatment: Gait belt;Rolling walker  OT Visit Diagnosis: Unsteadiness on feet (R26.81);Other abnormalities of gait and mobility (R26.89);Muscle weakness (generalized) (M62.81);Other symptoms and signs involving cognitive function;Hemiplegia and hemiparesis Hemiplegia - Right/Left: Left Hemiplegia - dominant/non-dominant: Non-Dominant Hemiplegia - caused by: Cerebral infarction   Activity Tolerance Patient tolerated treatment well   Patient Left in chair;with call bell/phone within reach;with chair alarm set   Nurse Communication Mobility status        Time: 1542-1600 OT Time Calculation (min): 18 min  Charges: OT General Charges $OT Visit: 1 Visit OT  Evaluation $OT Eval Moderate Complexity: 1 Mod OT Treatments $Self Care/Home Management : 8-22 mins  Darryl Nestle) Marsa Aris OTR/L Acute Rehabilitation Services Pager: [phone] Office: [place] 10/17/2018, 4:28 PM

## 2018-10-17 NOTE — Progress Notes (Addendum)
STROKE TEAM PROGRESS NOTE   INTERVAL HISTORY Pt RN is at bedside. Pt lying in bed and stated that she feel her left sided weakness has resolved. However, on exam she does have left nasolabial fold flattening not sure if her normal baseline. She has bilateral cataracts so her left upper quadrant not able to count fingers but able to see hand waving.  Vitals:   10/17/18 0500 10/17/18 0600 10/17/18 0700 10/17/18 0800  BP: 117/60 129/62 (!) 159/64 (!) 132/57  Pulse: (!) 56 (!) 56 64 60  Resp: 13 14 15 14   Temp:    98.5 F (36.9 C)  TempSrc:    Oral  SpO2: 95% 96% 97% 96%  Weight:        CBC:  Recent Labs  Lab 10/16/18 1448 10/16/18 1458  WBC 4.1  --   NEUTROABS 2.6  --   HGB 14.2 14.6  HCT 43.4 43.0  MCV 97.7  --   PLT 195  --     Basic Metabolic Panel:  Recent Labs  Lab 10/16/18 1448 10/16/18 1458  NA 138 139  K 4.1 4.1  CL 104 105  CO2 24  --   GLUCOSE 89 84  BUN 14 15  CREATININE 0.74 0.70  CALCIUM 8.9  --    Lipid Panel:     Component Value Date/Time   CHOL 162 10/17/2018 0627   TRIG 59 10/17/2018 0627   HDL 49 10/17/2018 0627   CHOLHDL 3.3 10/17/2018 0627   VLDL 12 10/17/2018 0627   LDLCALC 101 (H) 10/17/2018 0627   HgbA1c:  Lab Results  Component Value Date   HGBA1C 5.2 10/17/2018   Urine Drug Screen:     Component Value Date/Time   LABOPIA NONE DETECTED 10/16/2018 1448   COCAINSCRNUR NONE DETECTED 10/16/2018 1448   LABBENZ NONE DETECTED 10/16/2018 1448   AMPHETMU NONE DETECTED 10/16/2018 1448   THCU NONE DETECTED 10/16/2018 1448   LABBARB NONE DETECTED 10/16/2018 1448    Alcohol Level     Component Value Date/Time   ETH <10 10/16/2018 1448    IMAGING Ct Angio Head W Or Wo Contrast  Result Date: 10/16/2018 CLINICAL DATA:  LEFT-sided weakness. EXAM: CT ANGIOGRAPHY HEAD AND NECK TECHNIQUE: Multidetector CT imaging of the head and neck was performed using the standard protocol during bolus administration of intravenous contrast. Multiplanar  CT image reconstructions and MIPs were obtained to evaluate the vascular anatomy. Carotid stenosis measurements (when applicable) are obtained utilizing NASCET criteria, using the distal internal carotid diameter as the denominator. CONTRAST:  41mL OMNIPAQUE IOHEXOL 350 MG/ML SOLN COMPARISON:  CT head earlier today. FINDINGS: CTA NECK FINDINGS Aortic arch: Standard branching. Imaged portion shows no evidence of aneurysm or dissection. No significant stenosis of the major arch vessel origins. Mild LEFT subclavian disease is nonstenotic. Aortic atherosclerosis. Right carotid system: No evidence of dissection, stenosis (50% or greater) or occlusion. Left carotid system: No evidence of dissection, stenosis (50% or greater) or occlusion. Vertebral arteries: Codominant. No evidence of dissection, stenosis (50% or greater) or occlusion. Skeleton: Moderate spondylosis. No worrisome osseous finding. Other neck: No neck masses. Upper chest: Mild vascular congestion in the lungs. No pneumothorax. No mass. Review of the MIP images confirms the above findings CTA HEAD FINDINGS Anterior circulation: No significant stenosis, proximal occlusion, aneurysm, or vascular malformation. Posterior circulation: No significant stenosis, proximal occlusion, aneurysm, or vascular malformation. Venous sinuses: As permitted by contrast timing, patent. Anatomic variants: None of significance Delayed phase: Is not performed Review  of the MIP images confirms the above findings IMPRESSION: No intracranial or extracranial stenosis of significance. Electronically Signed   By: Staci Righter M.D.   On: 10/16/2018 15:47   Ct Angio Neck W Or Wo Contrast  Result Date: 10/16/2018 CLINICAL DATA:  LEFT-sided weakness. EXAM: CT ANGIOGRAPHY HEAD AND NECK TECHNIQUE: Multidetector CT imaging of the head and neck was performed using the standard protocol during bolus administration of intravenous contrast. Multiplanar CT image reconstructions and MIPs were  obtained to evaluate the vascular anatomy. Carotid stenosis measurements (when applicable) are obtained utilizing NASCET criteria, using the distal internal carotid diameter as the denominator. CONTRAST:  10mL OMNIPAQUE IOHEXOL 350 MG/ML SOLN COMPARISON:  CT head earlier today. FINDINGS: CTA NECK FINDINGS Aortic arch: Standard branching. Imaged portion shows no evidence of aneurysm or dissection. No significant stenosis of the major arch vessel origins. Mild LEFT subclavian disease is nonstenotic. Aortic atherosclerosis. Right carotid system: No evidence of dissection, stenosis (50% or greater) or occlusion. Left carotid system: No evidence of dissection, stenosis (50% or greater) or occlusion. Vertebral arteries: Codominant. No evidence of dissection, stenosis (50% or greater) or occlusion. Skeleton: Moderate spondylosis. No worrisome osseous finding. Other neck: No neck masses. Upper chest: Mild vascular congestion in the lungs. No pneumothorax. No mass. Review of the MIP images confirms the above findings CTA HEAD FINDINGS Anterior circulation: No significant stenosis, proximal occlusion, aneurysm, or vascular malformation. Posterior circulation: No significant stenosis, proximal occlusion, aneurysm, or vascular malformation. Venous sinuses: As permitted by contrast timing, patent. Anatomic variants: None of significance Delayed phase: Is not performed Review of the MIP images confirms the above findings IMPRESSION: No intracranial or extracranial stenosis of significance. Electronically Signed   By: Staci Righter M.D.   On: 10/16/2018 15:47   Ct Head Code Stroke Wo Contrast  Result Date: 10/16/2018 CLINICAL DATA:  Code stroke.  Left-sided weakness EXAM: CT HEAD WITHOUT CONTRAST TECHNIQUE: Contiguous axial images were obtained from the base of the skull through the vertex without intravenous contrast. COMPARISON:  None. FINDINGS: Brain: Subtle hypodensity in the right posterior frontal white matter. This may  represent acute infarction. Otherwise no acute infarct, hemorrhage, mass. Ventricle size is normal. Vascular: Negative for hyperdense vessel Skull: Negative Sinuses/Orbits: Mucoperiosteal thickening right maxillary sinus. Remaining sinuses clear. Bilateral cataract surgery. Other: None ASPECTS ([place] Stroke Program Early CT Score) - Ganglionic level infarction (caudate, lentiform nuclei, internal capsule, insula, M1-M3 cortex): 7 - Supraganglionic infarction (M4-M6 cortex): 3 Total score (0-10 with 10 being normal): 10 IMPRESSION: 1. Hypodensity right posterior frontal lobe may represent acute infarct. This appears to be predominantly in the white matter therefore no points adducted on aspect score. 2. ASPECTS is 10 Electronically Signed   By: Franchot Gallo M.D.   On: 10/16/2018 15:04    PHYSICAL EXAM  Temp:  [97.6 F (36.4 C)-98.6 F (37 C)] 98.5 F (36.9 C) (05/20 1200) Pulse Rate:  [54-74] 66 (05/20 1400) Resp:  [12-23] 17 (05/20 1400) BP: (116-164)/(53-76) 131/53 (05/20 1400) SpO2:  [95 %-100 %] 97 % (05/20 1400) Weight:  [72.1 kg] 72.1 kg (05/19 1500)  General - Well nourished, well developed, in no apparent distress.  Ophthalmologic - fundi not visualized due to noncooperation.  Cardiovascular - Regular rate and rhythm.  Mental Status -  Level of arousal and orientation to time, place, and person were intact. Language including expression, naming, repetition, comprehension was assessed and found intact. Fund of Knowledge was assessed and was intact.  Cranial Nerves II - XII -  II - Visual field intact OU except left upper quadrant she can tell hand waving but not finger counting. III, IV, VI - Extraocular movements intact. V - Facial sensation intact bilaterally. VII - right nasolabial fold flattening, not sure if normal baseline. VIII - Hearing & vestibular intact bilaterally. X - Palate elevates symmetrically. XI - Chin turning & shoulder shrug intact bilaterally. XII -  Tongue protrusion intact.  Motor Strength - [name] strength was normal in all extremities and pronator drift was absent.  Bulk was normal and fasciculations were absent.   Motor Tone - Muscle tone was assessed at the neck and appendages and was normal.  Reflexes - [name] reflexes were symmetrical in all extremities and she had no pathological reflexes.  Sensory - Light touch, temperature/pinprick were assessed and were symmetrical.    Coordination - The patient had normal movements in the hands and feet with no ataxia or dysmetria.  Tremor was absent.  Gait and Station - deferred.   ASSESSMENT/PLAN Ms. TORII ROYSE is a 73 y.o. female with history of HA, anxiety presenting with cramping and weakness of the L leg followed by L arm and numbness on the L side. Received tPA 10/16/2018 at 1515.  Possible right brain TIA  Code Stroke CT head Hypodensity R posterior frontal lobe. ASPECTS 10.     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 Continue DAPT for 3 weeks and then plavix alone.  Therapy recommendations:  HH PT  Disposition:  pending   Blood Pressure  Home meds:  none BP goal per post tPA protocol x 24h following tPA administration . Stable .  Long-term BP goal normotensive  Hyperlipidemia  Home meds:  No statin  LDL 101, goal < 70  Add lipitor 20  Continue statin at discharge  Other Stroke Risk Factors  Advanced age  Overweight, Body mass index is 29.07 kg/m., recommend weight loss, diet and exercise as appropriate   Other Active Problems  Anxiety - husband transferred to Hospice on the day of her admission, stroke symptoms  Hx of headaches  Hospital day # 1  This patient is critically ill due to suspected stroke s/p  tPA and at significant risk of neurological worsening, death form recurrent stroke, hemorrhagic conversion, brain edema. This patient's care requires constant monitoring of vital signs, hemodynamics, respiratory and cardiac monitoring, review of multiple databases, neurological assessment, discussion with family, other specialists and medical decision making of high complexity. I spent 35 minutes of neurocritical care time in the care of this patient. Tried to talk with pt husband over the phone but not available, left VM for call back.  Rosalin Hawking, MD PhD Stroke Neurology 10/17/2018 2:48 PM   To contact Stroke Continuity provider, please refer to [email or web address] After hours, contact General Neurology

## 2018-10-17 NOTE — Progress Notes (Signed)
OT Cancellation Note  Patient Details Name: Brittany Osborn MRN: [id number] DOB: March 18, 1946   Cancelled Treatment:    Reason Eval/Treat Not Completed: Active bedrest order. Will return as schedule allows. Thank you.   [place], OTR/L Acute Rehab Pager: [phone] Office: [phone] 10/17/2018, 7:35 AM

## 2018-10-17 NOTE — Evaluation (Signed)
Clinical/Bedside Swallow Evaluation Patient Details  Name: Brittany Osborn MRN: [id number] Date of Birth: [birth year]  Today's Date: 10/17/2018 Time: SLP Start Time (ACUTE ONLY): 5465 SLP Stop Time (ACUTE ONLY): 0933 SLP Time Calculation (min) (ACUTE ONLY): 10 min  Past Medical History:  Past Medical History:  Diagnosis Date  . Anxiety   . Arthritis   . Cataracts, bilateral   . GERD (gastroesophageal reflux disease)   . H/O hiatal hernia   . KPTWSFKC(127.5)    Past Surgical History:  Past Surgical History:  Procedure Laterality Date  . CESAREAN SECTION    . COLONOSCOPY  09/16/2011   Procedure: COLONOSCOPY;  Surgeon: Beryle Beams, MD;  Location: [place];  Service: Endoscopy;  Laterality: N/A;  . ESOPHAGOGASTRODUODENOSCOPY  09/16/2011   Procedure: ESOPHAGOGASTRODUODENOSCOPY (EGD);  Surgeon: Beryle Beams, MD;  Location: [facility] ENDOSCOPY;  Service: Endoscopy;  Laterality: N/A;  Per Dr. Benson Norway, pt wants do procedures at [facility] instead of [facility]  . LAPAROSCOPIC NISSEN FUNDOPLICATION N/A 1/70/0174   Procedure: LAPAROSCOPIC NISSEN FUNDOPLICATION laparoscopic hiatal hernia repair  ;  Surgeon: Shann Medal, MD;  Location: [facility] ORS;  Service: General;  Laterality: N/A;  . TONSILLECTOMY     HPI:  Brittany Osborn is a 73 y.o. female past medical history of GERD, hiatal hernia with Nissen fundoplication, headaches, hypertension, anxiety admitted with sudden onset of cramping and weakness of the left leg, weakness of the left arm and numbness of the left side of the body. CT ypodensity right posterior frontal lobe may represent acute infarct. MRI pending.    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 Consumed approximately 2 oz of 3 oz water test before having to hesitate. There was no cough and vocal quality remained clear and respirations stable. She admits to coughing more than  normal prior to admit with liquids and solids equally and "sometimes it comes up." Nissen fundoplication surgery in 9449. SLP educated and discussed precautions for reflux to decrease symptoms and suggested seeking medicaL assist if this worsens. Recommend regular texture, thin liquids, pills with thin.  SLP Visit Diagnosis: Dysphagia, unspecified (R13.10)    Aspiration Risk  Mild aspiration risk    Diet Recommendation Regular;Thin liquid   Liquid Administration via: Cup;Straw Medication Administration: Whole meds with liquid Supervision: Patient able to self feed Compensations: Slow rate;Small sips/bites Postural Changes: Seated upright at 90 degrees;Remain upright for at least 30 minutes after po intake    Other  Recommendations Recommended Consults: Consider GI evaluation;Other (Comment)(after d/c) Oral Care Recommendations: Oral care BID   Follow up Recommendations None      Frequency and Duration            Prognosis        Swallow Study   General HPI: Brittany Osborn is a 73 y.o. female past medical history of GERD, hiatal hernia with Nissen fundoplication, headaches, hypertension, anxiety admitted with sudden onset of cramping and weakness of the left leg, weakness of the left arm and numbness of the left side of the body. CT ypodensity right posterior frontal lobe may represent acute infarct. MRI pending.  Type of Study: Bedside Swallow Evaluation Previous Swallow Assessment: (none) Diet Prior to this Study: NPO Temperature Spikes Noted: No Respiratory Status: Room air History of Recent Intubation: No Behavior/Cognition: Alert;Cooperative;Pleasant mood Oral Cavity Assessment: Within Functional Limits Oral Care Completed by SLP: No Oral Cavity -  Dentition: Adequate natural dentition Vision: Functional for self-feeding Self-Feeding Abilities: Able to feed self Patient Positioning: Upright in bed Baseline Vocal Quality: Normal Volitional Cough: Strong Volitional  Swallow: Able to elicit    Oral/Motor/Sensory Function Overall Oral Motor/Sensory Function: Mild impairment(hx Bells palsy- trace weak on left-functional)   Ice Chips Ice chips: Not tested   Thin Liquid Thin Liquid: Within functional limits Presentation: Cup;Straw    Nectar Thick Nectar Thick Liquid: Not tested   Honey Thick Honey Thick Liquid: Not tested   Puree Puree: Within functional limits   Solid     Solid: Within functional limits      [place] Siren 10/17/2018,10:59 AM  Orbie Pyo Colvin Caroli.Ed Risk analyst [phone] Office [phone]

## 2018-10-17 NOTE — Progress Notes (Signed)
PT Cancellation Note  Patient Details Name: MKAYLA STEELE MRN: [id number] DOB: 30-Aug-1945   Cancelled Treatment:    Reason Eval/Treat Not Completed: Active bedrest order   Ellamae Sia, PT, DPT Acute Rehabilitation Services Pager [phone] Office [phone]    Willy Eddy 10/17/2018, 7:44 AM

## 2018-10-17 NOTE — TOC Initial Note (Signed)
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 He just entered Hospice facility yesterday.  Pt is planning on staying with her daughter Brittany Osborn and then back to her home with her son Thursday-Sunday. CM is working to find [facility] agency that may be able to accommodate this schedule.   Expected Discharge Plan: [place] Barriers to Discharge: Continued Medical Work up   Patient Goals and CMS Choice Patient states their goals for this hospitalization and ongoing recovery are:: to get better and recover lt side CMS Medicare.gov Compare Post Acute Care list provided to:: Patient Choice offered to / list presented to : Patient  Expected Discharge Plan and Services Expected Discharge Plan: [place]   Discharge Planning Services: CM Consult   Living arrangements for the past 2 months: Single Family Home                                      Prior Living Arrangements/Services Living arrangements for the past 2 months: Single Family Home Lives with:: Spouse(who has vascular dementia and entered hospice house yesterday) Patient language and need for interpreter reviewed:: Yes(no needs) Do you feel safe going back to the place where you live?: Yes      Need for Family Participation in Patient Care: Yes (Comment) Care giver support system in place?: Yes (comment)(going to stay with kids at d/c) Current home services: DME(rollator/ elevated toilet seat/ grab bars in tub) Criminal Activity/Legal Involvement Pertinent to Current Situation/Hospitalization: No - Comment as needed  Activities of Daily Living      Permission Sought/Granted                   Emotional Assessment Appearance:: Appears stated age Attitude/Demeanor/Rapport: Crying Affect (typically observed): Accepting, Tearful/Crying Orientation: : Oriented to Self, Oriented to Place, Oriented to  Time, Oriented to Situation   Psych Involvement: No (comment)  Admission diagnosis:  L sided weakness, Stroke sx Patient Active Problem List   Diagnosis Date Noted  . Stroke ([place]) 10/16/2018  . Tension headache 07/12/2018  . Hypertension 04/03/2018  . Routine health maintenance 04/03/2018  . Varicose veins of left lower extremity 04/03/2018  . History of hiatal hernia, repaired 11/12/2012 11/29/2012   PCP:  Asencion Noble, MD Pharmacy:   [id number] - [place], [place] - [address] [place] [address] Phone: [phone] Fax: [phone]     Social Determinants of Health (SDOH) Interventions    Readmission Risk Interventions No flowsheet data found.

## 2018-10-18 DIAGNOSIS — G44209 Tension-type headache, unspecified, not intractable: Secondary | ICD-10-CM

## 2018-10-18 MED ORDER — CLOPIDOGREL BISULFATE 75 MG PO TABS: 75.0000 mg | ORAL_TABLET | Freq: Every day | ORAL | 11 refills | Status: DC

## 2018-10-18 MED ORDER — ATORVASTATIN CALCIUM 20 MG PO TABS: 20.0000 mg | ORAL_TABLET | Freq: Every day | ORAL | 6 refills | Status: DC

## 2018-10-18 NOTE — Discharge Instructions (Signed)
1. Take Aspirin 81 mg daily with Plavix 75 mg daily for 3 weeks. After 3 weeks (starting Friday June 12th) stop taking aspirin but continue to take Plavix daily. 2. Home physical and occupational therapy has been arranged.

## 2018-10-18 NOTE — Progress Notes (Signed)
Pt. Given discharge instructions, verbalized understanding. Will wheel down via wheelchair once ride arrives.

## 2018-10-18 NOTE — Progress Notes (Signed)
Physical Therapy Treatment Patient Details Name: Brittany Osborn MRN: [id number] DOB: 1945/10/08 Today's Date: 10/18/2018    History of Present Illness Pt is a 73 y.o. F with significant PMH of headaches, hypertension, anxiety who was admitted with suddent cramping and weakness of left leg, weakness of left arm and numbness on left side of body. CT hypodensity right posterior frontal lobe may represent acute infarct, MRI pending. tPA given 5/19.    PT Comments    Patient seen for mobility progression. Pt is making progress toward PT goals and tolerated session well. Current plan remains appropriate.    Follow Up Recommendations  Home health PT;Supervision for mobility/OOB     Equipment Recommendations  None recommended by PT    Recommendations for Other Services       Precautions / Restrictions Precautions Precautions: Fall Restrictions Weight Bearing Restrictions: No    Mobility  Bed Mobility Overal bed mobility: Modified Independent Bed Mobility: Supine to Sit     Supine to sit: Min assist(without railings as pt has none at home. Pt does have adjustable bed) Sit to supine: Supervision   General bed mobility comments: increased time and effort   Transfers Overall transfer level: Needs assistance Equipment used: Rolling walker (2 wheeled) Transfers: Sit to/from Stand Sit to Stand: Min guard         General transfer comment: min guard for safety  Ambulation/Gait Ambulation/Gait assistance: Min guard Gait Distance (Feet): 100 Feet Assistive device: Rolling walker (2 wheeled) Gait Pattern/deviations: Step-through pattern;Decreased stride length;Narrow base of support;Decreased step length - left;Trunk flexed Gait velocity: decreased   General Gait Details: guarded and slow gait; cues for upright posture, forward gaze, and increased bilat step lengths and cadence   Stairs             Wheelchair Mobility    Modified Rankin (Stroke Patients  Only) Modified Rankin (Stroke Patients Only) Pre-Morbid Rankin Score: No symptoms Modified Rankin: Moderately severe disability     Balance Overall balance assessment: Needs assistance Sitting-balance support: Feet supported Sitting balance-Leahy Scale: Good     Standing balance support: Bilateral upper extremity supported Standing balance-Leahy Scale: Fair                              Cognition Arousal/Alertness: Awake/alert Behavior During Therapy: WFL for tasks assessed/performed Overall Cognitive Status: Within Functional Limits for tasks assessed                             Awareness: (pt demonstrating good safety awareness today "I would be so afraid to get up by myself - my daughter will be with me all the time. ) Problem Solving: Requires verbal cues(for sit to stand and use of RW) General Comments: pt crying and discussing husband being in hospice and at end of life      Exercises      General Comments        Pertinent Vitals/Pain Pain Assessment: No/denies pain Pain Score: 3  Pain Location: upper left leg from "sitting too long in the recliner " Pain Descriptors / Indicators: Sore Pain Intervention(s): Monitored during session;Repositioned    Home Living                      Prior Function            PT Goals (current goals can now be found in  the care plan section) Progress towards PT goals: Progressing toward goals    Frequency    Min 4X/week      PT Plan Current plan remains appropriate    Co-evaluation              AM-PAC PT "6 Clicks" Mobility   Outcome Measure  Help needed turning from your back to your side while in a flat bed without using bedrails?: None Help needed moving from lying on your back to sitting on the side of a flat bed without using bedrails?: None Help needed moving to and from a bed to a chair (including a wheelchair)?: None Help needed standing up from a chair using your arms  (e.g., wheelchair or bedside chair)?: None Help needed to walk in hospital room?: A Little Help needed climbing 3-5 steps with a railing? : A Little 6 Click Score: 22    End of Session Equipment Utilized During Treatment: Gait belt Activity Tolerance: Patient tolerated treatment well Patient left: in chair;with call bell/phone within reach Nurse Communication: Mobility status PT Visit Diagnosis: Unsteadiness on feet (R26.81);Difficulty in walking, not elsewhere classified (R26.2)     Time: 1224-4975 PT Time Calculation (min) (ACUTE ONLY): 37 min  Charges:  $Gait Training: 23-37 mins                     Earney Navy, PTA Acute Rehabilitation Services Pager: [phone] Office: [phone]     Darliss Cheney 10/18/2018, 2:33 PM

## 2018-10-18 NOTE — TOC Transition Note (Signed)
Transition of Care [facility]) - CM/SW Discharge Note   Patient Details  Name: Brittany Osborn MRN: [id number] Date of Birth: 10-25-1945  Transition of Care [facility] CM/SW Contact:  Pollie Friar, RN Phone Number: 10/18/2018, 12:08 PM   Clinical Narrative:    Pt is discharging home/ daughters today. CM was able to arrange with [facility] that [facility] will see her at her home and at her daughters: [place] in [place]. Pt has transportation home.   Final next level of care: [place] Barriers to Discharge: Barriers Resolved   Patient Goals and CMS Choice Patient states their goals for this hospitalization and ongoing recovery are:: to get better and recover lt side CMS Medicare.gov Compare Post Acute Care list provided to:: Patient Choice offered to / list presented to : Patient  Discharge Placement                       Discharge Plan and Services   Discharge Planning Services: CM Consult Post Acute Care Choice: Home Health, Durable Medical Equipment          DME Arranged: Walker rolling DME Agency: [employer] Date DME Agency Contacted: 10/18/18 Time DME Agency Contacted: [id number] Representative spoke with at DME Agency: [place]: PT, OT [place] Agency: [place] Date [place]: 10/18/18 Time [place]: 1115 Representative spoke with at [place]: [place]) Interventions     Readmission Risk Interventions No flowsheet data found.

## 2018-10-18 NOTE — Discharge Summary (Addendum)
Patient ID: Brittany Osborn   MRN: [id number]      DOB: December 05, 1945  Date of Admission: 10/16/2018 Date of Discharge: 10/18/2018  Attending Physician:  Brittany Hawking, MD, Stroke MD Consultant(s):    None  Patient's PCP:  Brittany Noble, MD  DISCHARGE DIAGNOSIS:  Suspected stroke treated with IV tPA.  Active Problems:   TIA   Anxiety   HLD   Hx of headache   Past Medical History:  Diagnosis Date  . Anxiety   . Arthritis   . Cataracts, bilateral   . GERD (gastroesophageal reflux disease)   . H/O hiatal hernia   . KGMWNUUV(253.6)    Past Surgical History:  Procedure Laterality Date  . CESAREAN SECTION    . COLONOSCOPY  09/16/2011   Procedure: COLONOSCOPY;  Surgeon: Brittany Beams, MD;  Location: [facility];  Service: Endoscopy;  Laterality: N/A;  . ESOPHAGOGASTRODUODENOSCOPY  09/16/2011   Procedure: ESOPHAGOGASTRODUODENOSCOPY (EGD);  Surgeon: Brittany Beams, MD;  Location: [facility] ENDOSCOPY;  Service: Endoscopy;  Laterality: N/A;  Per Dr. Benson Osborn, pt wants do procedures at [facility] instead of [facility]  . LAPAROSCOPIC NISSEN FUNDOPLICATION N/A 6/44/0347   Procedure: LAPAROSCOPIC NISSEN FUNDOPLICATION laparoscopic hiatal hernia repair  ;  Surgeon: Brittany Medal, MD;  Location: [facility] ORS;  Service: General;  Laterality: N/A;  . TONSILLECTOMY      Family History Family History  Problem Relation Age of Onset  . Heart disease Mother   . Lung cancer Father     Social History  reports that she has never smoked. She has never used smokeless tobacco. She reports that she does not drink alcohol or use drugs.  Allergies as of 10/18/2018      Reactions   Strawberry Extract Anaphylaxis   Sulfa Drugs Cross Reactors Nausea And Vomiting   Codeine Other (See Comments)   Headache      Medication List    TAKE these medications   aspirin EC 81 MG tablet Take 81 mg by mouth at bedtime.   atorvastatin 20 MG tablet Commonly known as:  LIPITOR Take 1 tablet (20 mg total) by mouth daily at 6 PM.    clopidogrel 75 MG tablet Commonly known as:  PLAVIX Take 1 tablet (75 mg total) by mouth daily.            Durable Medical Equipment  (From admission, onward)         Start     Ordered   10/18/18 1147  For home use only DME Walker rolling  Once    Question:  Patient needs a walker to treat with the following condition  Answer:  Stroke ([place])   10/18/18 [place] Medications Prior to Admission  Medication Sig Dispense Refill  . aspirin EC 81 MG tablet Take 81 mg by mouth at bedtime.       HOSPITAL MEDICATIONS .  stroke: mapping our early stages of recovery book   Does not apply Once  . aspirin EC  81 mg Oral QHS  . atorvastatin  20 mg Oral q1800  . clopidogrel  75 mg Oral Daily  . pantoprazole  40 mg Oral Daily    LABORATORY STUDIES CBC    Component Value Date/Time   WBC 4.1 10/16/2018 1448   RBC 4.44 10/16/2018 1448   HGB 14.6 10/16/2018 1458   HGB 15.3 04/02/2018 1556   HCT 43.0 10/16/2018 1458  HCT 44.2 04/02/2018 1556   PLT 195 10/16/2018 1448   PLT 245 04/02/2018 1556   MCV 97.7 10/16/2018 1448   MCV 92 04/02/2018 1556   MCH 32.0 10/16/2018 1448   MCHC 32.7 10/16/2018 1448   RDW 12.2 10/16/2018 1448   RDW 12.1 (L) 04/02/2018 1556   LYMPHSABS 1.1 10/16/2018 1448   LYMPHSABS 1.4 04/02/2018 1556   MONOABS 0.4 10/16/2018 1448   EOSABS 0.0 10/16/2018 1448   EOSABS 0.2 04/02/2018 1556   BASOSABS 0.0 10/16/2018 1448   BASOSABS 0.0 04/02/2018 1556   CMP    Component Value Date/Time   NA 139 10/16/2018 1458   NA 140 07/12/2018 1519   K 4.1 10/16/2018 1458   CL 105 10/16/2018 1458   CO2 24 10/16/2018 1448   GLUCOSE 84 10/16/2018 1458   BUN 15 10/16/2018 1458   BUN 18 07/12/2018 1519   CREATININE 0.70 10/16/2018 1458   CALCIUM 8.9 10/16/2018 1448   PROT 6.8 10/16/2018 1448   ALBUMIN 4.2 10/16/2018 1448   AST 21 10/16/2018 1448   ALT 18 10/16/2018 1448   ALKPHOS 53 10/16/2018 1448   BILITOT 0.8  10/16/2018 1448   GFRNONAA >60 10/16/2018 1448   GFRAA >60 10/16/2018 1448   COAGS Lab Results  Component Value Date   INR 1.0 10/16/2018   Lipid Panel    Component Value Date/Time   CHOL 162 10/17/2018 0627   TRIG 59 10/17/2018 0627   HDL 49 10/17/2018 0627   CHOLHDL 3.3 10/17/2018 0627   VLDL 12 10/17/2018 0627   LDLCALC 101 (H) 10/17/2018 0627   HgbA1C  Lab Results  Component Value Date   HGBA1C 5.2 10/17/2018   Urinalysis    Component Value Date/Time   COLORURINE STRAW (A) 10/16/2018 1448   APPEARANCEUR CLEAR 10/16/2018 1448   LABSPEC 1.004 (L) 10/16/2018 1448   PHURINE 7.0 10/16/2018 1448   GLUCOSEU NEGATIVE 10/16/2018 1448   HGBUR SMALL (A) 10/16/2018 1448   BILIRUBINUR NEGATIVE 10/16/2018 1448   KETONESUR NEGATIVE 10/16/2018 1448   PROTEINUR NEGATIVE 10/16/2018 1448   NITRITE NEGATIVE 10/16/2018 1448   LEUKOCYTESUR NEGATIVE 10/16/2018 1448   Urine Drug Screen     Component Value Date/Time   LABOPIA NONE DETECTED 10/16/2018 1448   COCAINSCRNUR NONE DETECTED 10/16/2018 1448   LABBENZ NONE DETECTED 10/16/2018 1448   AMPHETMU NONE DETECTED 10/16/2018 1448   THCU NONE DETECTED 10/16/2018 1448   LABBARB NONE DETECTED 10/16/2018 1448    Alcohol Level    Component Value Date/Time   ETH <10 10/16/2018 1448     SIGNIFICANT DIAGNOSTIC STUDIES  Ct Angio Head W Or Wo Contrast Ct Angio Neck W Or Wo Contrast 10/16/2018 IMPRESSION:  No intracranial or extracranial stenosis of significance.   Mr Brain Wo Contrast 10/17/2018 IMPRESSION:  Atrophy and small vessel disease. Chronic RIGHT frontal white matter infarct. No acute intracranial findings. No concerning areas of susceptibility to suggest post tPA hemorrhage. No extra-axial collections.   Ct Head Code Stroke Wo Contrast 10/16/2018 IMPRESSION:  1. Hypodensity right posterior frontal lobe may represent acute infarct. This appears to be predominantly in the white matter therefore no points adducted on  aspect score. 2. ASPECTS is 10      HISTORY OF PRESENT ILLNESS (from Dr Brittany Osborn H&P on 10/16/2018) Brittany Osborn is a 73 y.o. female past medical history of headaches, hypertension, anxiety, in her usual state of health which she says was about an hour prior to presentation so around 1:45 PM when  she had a sudden onset of cramping and weakness of the left leg followed by weakness of the left arm and numbness of the left side of the body. She reports that she had not been feeling well and was developing what she felt like was some swelling on the left lower extremity since yesterday but it was suddenly at 1:45 PM today that she had a sudden onset of this weakness and also numbness on the left side of the body. She has had multiple headaches recently, mainly frontal, diagnosed as tension headaches by her primary care team due to ongoing stressors-her husband has been pending hospice placement today, which has been off huge concern to her and been bothering her now for a while. She denies any preceding headaches prior to this acute change.  She said she had a headache yesterday but no headaches today.  She is never had similar symptoms in the past. Denies any history of strokes.  Denies any current respiratory symptoms.  Denies any fevers chills.  Denies any preceding malaise, chills fevers appetite loss.  Denies abdominal pain nausea vomiting diarrhea constipation.  Denies double vision headaches currently.  Denies focal tingling.  Reports the numbness and weakness on the left side. LKW: 1:45 PM tpa given?:  Yes Premorbid modified Rankin scale (mRS): 0 NIH stroke [place] COURSE Ms. JESSINA MARSE is a 73 y.o. female with history of HA, anxiety presenting with cramping and weakness of the L leg followed by L arm and numbness on the L side. Received tPA 10/16/2018 at 1515.  Right brain TIA vs. Anxiety vs. Complicated migarine  Code Stroke CT head Hypodensity R posterior frontal lobe. ASPECTS  10.     CTA head & neck Unremarkable   MRI no acute infarct, right frontal old infarcts  2D Echo EF 60-65%  LDL 101            HgbA1c 5.2  UDS negative  SCDs for VTE prophylaxis  Diet-regular, thin  aspirin 81 mg daily prior to admission, now on ASA 81 and plavix 75mg  daily. Continue DAPT for 3 weeks and then plavix alone.  Therapy recommendations:  HH PT and OT  Disposition:  Discharge to home  Anxiety/stress  Pt stated that she has a lot of stress lately at home  Husband transferred to hospice on the day of her admission and she has promised him to let him stay at home  She felt guilty not getting him home  She did not sleep for 3 days before admission  She has no help at home  She was overwhelmed.   Tension HA  She has been having HA before admission due to stress  She can not remember whether she had HA on the day of admission  Currently no HA  Blood Pressure  Home meds:  none  BP goal per post tPA protocol x 24h following tPA administration  Stable   Long-term BP goal normotensive  Hyperlipidemia  Home meds:  No statin  LDL 101, goal < 70  Add lipitor 20  Continue statin at discharge  Other Stroke Risk Factors  Advanced age  Overweight, Body mass index is 29.07 kg/m., recommend weight loss, diet and exercise as appropriate   Other Active Problems  Hx of headaches  Cataracts    DISCHARGE EXAM Vitals:   10/18/18 0007 10/18/18 0423 10/18/18 0733 10/18/18 1247  BP: (!) 142/65 (!) 140/57 128/61 (!) 150/67  Pulse: 66 67 (!) 57 63  Resp:  15 16 15 18   Temp: 98.4 F (36.9 C) 98.6 F (37 C) 98.6 F (37 C) 98 F (36.7 C)  TempSrc: Oral Oral Oral Oral  SpO2: 98% 95% 95% 98%  Weight:       General - Well nourished, well developed, in no apparent distress.  Ophthalmologic - fundi not visualized due to noncooperation.  Cardiovascular - Regular rate and rhythm.  Mental Status -  Level of arousal and orientation to  time, place, and person were intact. Language including expression, naming, repetition, comprehension was assessed and found intact. Fund of Knowledge was assessed and was intact.  Cranial Nerves II - XII - II - Visual field intact OU except left upper quadrant she can tell hand waving but not finger counting. III, IV, VI - Extraocular movements intact. V - Facial sensation intact bilaterally. VII - left nasolabial fold flattening, not sure if normal baseline. VIII - Hearing & vestibular intact bilaterally. X - Palate elevates symmetrically. XI - Chin turning & shoulder shrug intact bilaterally. XII - Tongue protrusion intact.  Motor Strength - [name] strength was normal in all extremities and pronator drift was absent.  Bulk was normal and fasciculations were absent.   Motor Tone - Muscle tone was assessed at the neck and appendages and was normal.  Reflexes - [name] reflexes were symmetrical in all extremities and she had no pathological reflexes.  Sensory - Light touch, temperature/pinprick were assessed and were symmetrical.    Coordination - The patient had normal movements in the hands and feet with no ataxia or dysmetria.  Tremor was absent.  Gait and Station - walk with PT with walker, steady, but mildly slow.  Discharge Diet    Diet Order            Diet regular Room service appropriate? Yes; Fluid consistency: Thin  Diet effective now             liquids  DISCHARGE PLAN  Disposition:  Discharge to home  Secondary stroke prevention - ASA 81 and plavix 75mg  daily for 3 weeks and then plavix alone.  Ongoing risk factor control by Primary Care Physician at time of discharge  Follow-up Brittany Noble, MD in 2 weeks.  Follow-up in [facility] in 4 weeks, office to schedule an appointment.   35 minutes were spent preparing discharge.  Brittany Hawking, MD PhD Stroke Neurology 10/18/2018 5:27 PM

## 2018-10-18 NOTE — Progress Notes (Signed)
Occupational Therapy Treatment Patient Details Name: Brittany Osborn MRN: [id number] DOB: Sep 26, 1945 Today's Date: 10/18/2018    History of present illness Pt is a 73 y.o. F with significant PMH of headaches, hypertension, anxiety who was admitted with suddent cramping and weakness of left leg, weakness of left arm and numbness on left side of body. CT hypodensity right posterior frontal lobe may represent acute infarct, MRI pending. tPA given 5/19.   OT comments  Pt seen for OT today with emphasis on functional mobility, balance, self care and safety. Pt fearful of using RW however mobility is improved today. Pt demonstrating good safety awareness during session and states "my daughter will be with me all the time I would be afraid to get up by myself."  Pt with improved balance, LUE functional use and cognition today. Daughter to provide 24/7 supervision and assist PRN   Follow Up Recommendations  Home health OT;Supervision/Assistance - 24 hour    Equipment Recommendations  Tub/shower seat    Recommendations for Other Services PT consult;Speech consult    Precautions / Restrictions Precautions Precautions: Fall Restrictions Weight Bearing Restrictions: No       Mobility Bed Mobility Overal bed mobility: Needs Assistance Bed Mobility: Supine to Sit;Sit to Supine     Supine to sit: Min assist(without railings as pt has none at home. Pt does have adjustable bed) Sit to supine: Supervision      Transfers Overall transfer level: Needs assistance Equipment used: Rolling walker (2 wheeled) Transfers: Sit to/from Stand Sit to Stand: Min guard         General transfer comment: Pt fearful of RW. Able to tolerate head turns with functional ambulation without LOB. Pt with minor LOB posteriorly when reach down low    Balance Overall balance assessment: Needs assistance Sitting-balance support: Feet supported Sitting balance-Leahy Scale: Good     Standing balance support:  Bilateral upper extremity supported Standing balance-Leahy Scale: Fair                             ADL either performed or assessed with clinical judgement   ADL Overall ADL's : Needs assistance/impaired Eating/Feeding: Independent   Grooming: Wash/dry hands;Wash/dry face;Oral care;Applying deodorant;Brushing hair;Sitting;Modified independent Grooming Details (indicate cue type and reason): If in standing close supervision Upper Body Bathing: Sitting;Modified independent   Lower Body Bathing: Sit to/from stand;Min guard   Upper Body Dressing : Sitting;Set up   Lower Body Dressing: Min guard;Sit to/from stand   Toilet Transfer: Loss adjuster, chartered Details (indicate cue type and reason): using RW - pt very fearful of RW Toileting- Clothing Manipulation and Hygiene: Supervision/safety   Tub/ Banker: Min guard   Functional mobility during ADLs: Rolling walker;Min guard General ADL Comments: Pt fearful of RW - decreased balance, decreased activity tolerance. Using LUE well with RW pushing up to stand     Vision  No difficulty noted with visual scanning in busy hallway navigating with RW     Perception     Praxis      Cognition Arousal/Alertness: Awake/alert Behavior During Therapy: [facility] [employer] for tasks assessed/performed                               Awareness: (pt demonstrating good safety awareness today "I would be so afraid to get up by myself - my daughter will be with me all the time. ) Problem  Solving: Requires verbal cues(for sit to stand and use of RW)          Exercises     Shoulder Instructions       General Comments      Pertinent Vitals/ Pain       Pain Assessment: 0-10 Pain Score: 3  Pain Location: upper left leg from "sitting too long in the recliner " Pain Descriptors / Indicators: Sore Pain Intervention(s): Monitored during session;Repositioned  Home Living                                           Prior Functioning/Environment              Frequency  Min 3X/week        Progress Toward Goals  OT Goals(current goals can now be found in the care plan section)        Plan Discharge plan remains appropriate    Co-evaluation                 AM-PAC OT "6 Clicks" Daily Activity     Outcome Measure   Help from another person eating meals?: None Help from another person taking care of personal grooming?: A Little Help from another person toileting, which includes using toliet, bedpan, or urinal?: A Little Help from another person bathing (including washing, rinsing, drying)?: A Little Help from another person to put on and taking off regular upper body clothing?: A Little Help from another person to put on and taking off regular lower body clothing?: A Little 6 Click Score: 19    End of Session Equipment Utilized During Treatment: Gait belt;Rolling walker  OT Visit Diagnosis: Unsteadiness on feet (R26.81);Other abnormalities of gait and mobility (R26.89);Muscle weakness (generalized) (M62.81);Other symptoms and signs involving cognitive function;Hemiplegia and hemiparesis Hemiplegia - Right/Left: Left Hemiplegia - dominant/non-dominant: Non-Dominant Hemiplegia - caused by: Cerebral infarction   Activity Tolerance Patient tolerated treatment well   Patient Left in bed;with call bell/phone within reach;with bed alarm set   Nurse Communication          Time: 3646-8032 OT Time Calculation (min): 19 min  Charges: OT General Charges $OT Visit: 1 Visit OT Treatments $Self Care/Home Management : 8-22 mins     Quay Burow, OTR/L 10/18/2018, 11:49 AM

## 2018-10-21 ENCOUNTER — Encounter (HOSPITAL_COMMUNITY): Payer: Self-pay

## 2018-10-21 ENCOUNTER — Emergency Department (HOSPITAL_COMMUNITY)
Admission: EM | Admit: 2018-10-21 | Discharge: 2018-10-22 | Disposition: A | Discharge status: Home / Self Care | Payer: Medicare Other | Attending: Emergency Medicine | Admitting: Emergency Medicine

## 2018-10-21 ENCOUNTER — Emergency Department (HOSPITAL_COMMUNITY): Payer: Medicare Other

## 2018-10-21 ENCOUNTER — Other Ambulatory Visit: Payer: Self-pay

## 2018-10-21 DIAGNOSIS — I10 Essential (primary) hypertension: Secondary | ICD-10-CM | POA: Insufficient documentation

## 2018-10-21 DIAGNOSIS — R0902 Hypoxemia: Secondary | ICD-10-CM | POA: Diagnosis not present

## 2018-10-21 DIAGNOSIS — Z8673 Personal history of transient ischemic attack (TIA), and cerebral infarction without residual deficits: Secondary | ICD-10-CM | POA: Diagnosis not present

## 2018-10-21 DIAGNOSIS — R531 Weakness: Secondary | ICD-10-CM | POA: Diagnosis not present

## 2018-10-21 DIAGNOSIS — R29810 Facial weakness: Secondary | ICD-10-CM | POA: Diagnosis not present

## 2018-10-21 DIAGNOSIS — R29818 Other symptoms and signs involving the nervous system: Secondary | ICD-10-CM | POA: Diagnosis not present

## 2018-10-21 HISTORY — DX: Cerebral infarction, unspecified: I63.9

## 2018-10-21 LAB — BASIC METABOLIC PANEL
Anion gap: 6 (ref 5–15)
BUN: 12 mg/dL (ref 8–23)
CO2: 26 mmol/L (ref 22–32)
Calcium: 8.7 mg/dL — ABNORMAL LOW (ref 8.9–10.3)
Chloride: 104 mmol/L (ref 98–111)
Creatinine, Ser: 0.77 mg/dL (ref 0.44–1.00)
GFR calc Af Amer: >60 mL/min (ref >60)
GFR calc non Af Amer: >60 mL/min (ref >60)
Glucose, Bld: 99 mg/dL (ref 70–99)
Potassium: 4.1 mmol/L (ref 3.5–5.1)
Sodium: 136 mmol/L (ref 135–145)

## 2018-10-21 LAB — CBC WITH DIFFERENTIAL/PLATELET
Abs Immature Granulocytes: 0 10*3/uL (ref 0.00–0.07)
Basophils Absolute: 0 10*3/uL (ref 0.0–0.1)
Basophils Relative: 0 %
Eosinophils Absolute: 0.1 10*3/uL (ref 0.0–0.5)
Eosinophils Relative: 1 %
HCT: 40 % (ref 36.0–46.0)
Hemoglobin: 13.3 g/dL (ref 12.0–15.0)
Immature Granulocytes: 0 %
Lymphocytes Relative: 31 %
Lymphs Abs: 1.5 10*3/uL (ref 0.7–4.0)
MCH: 32.1 pg (ref 26.0–34.0)
MCHC: 33.3 g/dL (ref 30.0–36.0)
MCV: 96.6 fL (ref 80.0–100.0)
Monocytes Absolute: 0.5 10*3/uL (ref 0.1–1.0)
Monocytes Relative: 9 %
Neutro Abs: 2.8 10*3/uL (ref 1.7–7.7)
Neutrophils Relative %: 59 %
Platelets: 197 10*3/uL (ref 150–400)
RBC: 4.14 MIL/uL (ref 3.87–5.11)
RDW: 12.2 % (ref 11.5–15.5)
WBC: 4.8 10*3/uL (ref 4.0–10.5)
nRBC: 0 % (ref 0.0–0.2)

## 2018-10-21 MED ORDER — MIDAZOLAM HCL 2 MG/2ML IJ SOLN
1.0000 mg | Freq: Once | INTRAMUSCULAR | Status: AC
  Administered 2018-10-21: 22:00:00 1 mg via INTRAVENOUS
  Filled 2018-10-21: qty 2

## 2018-10-21 NOTE — ED Notes (Signed)
Patient transported to MRI 

## 2018-10-21 NOTE — ED Provider Notes (Signed)
[facility] EMERGENCY DEPARTMENT Provider Note   CSN: [id number] Arrival date & time: 10/21/18  1651    History   Chief Complaint Chief Complaint  Patient presents with  . Weakness    HPI Brittany Osborn is a 73 y.o. female.     73 yo F with a chief complaint of left-sided weakness.  This been going on since yesterday.  Was noted by physical therapy.  Patient was recently in the hospital with concern for stroke.  She got TPA on arrival and had an MRI and that was unremarkable.  It was thought that it was a TIA versus anxiety as she had just had her husband go into hospice and he just died yesterday.  There is also the possibility of a complicated migraine as the patient was having headaches prior to the event.  She had headaches prior to this event as well the headache is now resolved.  The history is provided by the patient.  Weakness  Associated symptoms: no arthralgias, no chest pain, no dizziness, no dysuria, no fever, no headaches, no myalgias, no nausea, no shortness of breath, no urgency and no vomiting   Illness  Severity:  Mild Onset quality:  Gradual Duration:  2 days Timing:  Constant Progression:  Unchanged Chronicity:  Recurrent Associated symptoms: no chest pain, no congestion, no fever, no headaches, no myalgias, no nausea, no rhinorrhea, no shortness of breath, no vomiting and no wheezing     Past Medical History:  Diagnosis Date  . Anxiety   . Arthritis   . Cataracts, bilateral   . GERD (gastroesophageal reflux disease)   . H/O hiatal hernia   . Headache(784.0)   . Stroke [facility])     Patient Active Problem List   Diagnosis Date Noted  . Stroke ([place]) 10/16/2018  . Tension headache 07/12/2018  . Hypertension 04/03/2018  . Routine health maintenance 04/03/2018  . Varicose veins of left lower extremity 04/03/2018  . History of hiatal hernia, repaired 11/12/2012 11/29/2012    Past Surgical History:  Procedure Laterality Date  .  CESAREAN SECTION    . COLONOSCOPY  09/16/2011   Procedure: COLONOSCOPY;  Surgeon: Beryle Beams, MD;  Location: [facility];  Service: Endoscopy;  Laterality: N/A;  . ESOPHAGOGASTRODUODENOSCOPY  09/16/2011   Procedure: ESOPHAGOGASTRODUODENOSCOPY (EGD);  Surgeon: Beryle Beams, MD;  Location: [facility] ENDOSCOPY;  Service: Endoscopy;  Laterality: N/A;  Per Dr. Benson Norway, pt wants do procedures at [facility] instead of [facility]  . LAPAROSCOPIC NISSEN FUNDOPLICATION N/A 8/45/3646   Procedure: LAPAROSCOPIC NISSEN FUNDOPLICATION laparoscopic hiatal hernia repair  ;  Surgeon: Shann Medal, MD;  Location: [facility] ORS;  Service: General;  Laterality: N/A;  . TONSILLECTOMY       OB History   No obstetric history on file.      Home Medications    Prior to Admission medications   Medication Sig Start Date End Date Taking? Authorizing Provider  aspirin EC 81 MG tablet Take 81 mg by mouth at bedtime.   Yes [provider]  atorvastatin (LIPITOR) 20 MG tablet Take 1 tablet (20 mg total) by mouth daily at 6 PM. 10/18/18  Yes Rinehuls, Early Chars, PA-C  clopidogrel (PLAVIX) 75 MG tablet Take 1 tablet (75 mg total) by mouth daily. 10/18/18  Yes Rinehuls, Early Chars, PA-C    Family History Family History  Problem Relation Age of Onset  . Heart disease Mother   . Lung cancer Father     Social History  Social History   Tobacco Use  . Smoking status: Never Smoker  . Smokeless tobacco: Never Used  Substance Use Topics  . Alcohol use: No  . Drug use: No     Allergies   Strawberry extract; Sulfa drugs cross reactors; and Codeine   Review of Systems Review of Systems  Constitutional: Negative for chills and fever.  HENT: Negative for congestion and rhinorrhea.   Eyes: Negative for redness and visual disturbance.  Respiratory: Negative for shortness of breath and wheezing.   Cardiovascular: Negative for chest pain and palpitations.  Gastrointestinal: Negative for nausea and vomiting.  Genitourinary: Negative for  dysuria and urgency.  Musculoskeletal: Negative for arthralgias and myalgias.  Skin: Negative for pallor and wound.  Neurological: Positive for weakness. Negative for dizziness and headaches.     Physical Exam Updated Vital Signs BP (!) 157/71   Pulse 63   Temp 98.5 F (36.9 C) (Oral)   Resp 18   Ht 5\' 2"  (1.575 m)   Wt 68.9 kg   SpO2 99%   BMI 27.80 kg/m   Physical Exam Vitals signs and nursing note reviewed.  Constitutional:      General: She is not in acute distress.    Appearance: She is well-developed. She is not diaphoretic.  HENT:     Head: Normocephalic and atraumatic.  Eyes:     Pupils: Pupils are equal, round, and reactive to light.  Neck:     Musculoskeletal: Normal range of motion and neck supple.  Cardiovascular:     Rate and Rhythm: Normal rate and regular rhythm.     Heart sounds: No murmur. No friction rub. No gallop.   Pulmonary:     Effort: Pulmonary effort is normal.     Breath sounds: No wheezing or rales.  Abdominal:     General: There is no distension.     Palpations: Abdomen is soft.     Tenderness: There is no abdominal tenderness.  Musculoskeletal:        General: No tenderness.  Skin:    General: Skin is warm and dry.  Neurological:     Mental Status: She is alert and oriented to person, place, and time.     Comments: Very mild if any left-sided weakness on exam.  Question right-sided facial droop.  Psychiatric:        Behavior: Behavior normal.      ED Treatments / Results  Labs (all labs ordered are listed, but only abnormal results are displayed) Labs Reviewed  BASIC METABOLIC PANEL - Abnormal; Notable for the following components:      Result Value   Calcium 8.7 (*)    All other components within normal limits  CBC WITH DIFFERENTIAL/PLATELET    EKG EKG Interpretation  Date/Time:  Sunday Oct 21 2018 17:11:47 EDT Ventricular Rate:  64 PR Interval:    QRS Duration: 80 QT Interval:  430 QTC Calculation: 444 R Axis:    41 Text Interpretation:  Sinus rhythm No significant change since last tracing Confirmed by Deno Etienne [phone]) on 10/21/2018 5:20:36 PM   Radiology No results found.  Procedures Procedures (including critical care time)  Medications Ordered in ED Medications  midazolam (VERSED) injection 1 mg (1 mg Intravenous Given 10/21/18 2143)     Initial Impression / Assessment and Plan / ED Course  I have reviewed the triage vital signs and the nursing notes.  Pertinent labs & imaging results that were available during my care of the patient were reviewed  by me and considered in my medical decision making (see chart for details).        73 yo F with a chief complaint of left-sided weakness.  This was noted yesterday by physical therapy.  Patient was just in the hospital for left-sided weakness that was thought to be related to a stroke but had a negative MRI.  I discussed the case with neurology, they recommended repeating the MRI today.  Awaiting MRI results.  Basic lab work performed and unremarkable.  Patient care was signed out to Dr. Roxanne Mins, please see his note for further details of care in the ED.  The patients results and plan were reviewed and discussed.   Any x-rays performed were independently reviewed by myself.   Differential diagnosis were considered with the presenting HPI.  Medications  midazolam (VERSED) injection 1 mg (1 mg Intravenous Given 10/21/18 2143)    Vitals:   10/21/18 1715 10/21/18 1730 10/21/18 2132 10/21/18 2145  BP: (!) 143/65 137/66 (!) 156/67 (!) 157/71  Pulse: 63 65 60 63  Resp: 16 16 18    Temp: 98.5 F (36.9 C)     TempSrc: Oral     SpO2: 99% 100% 100% 99%  Weight:      Height:        Final diagnoses:  Left-sided weakness      Final Clinical Impressions(s) / ED Diagnoses   Final diagnoses:  Left-sided weakness    ED Discharge Orders    None       Deno Etienne, DO 10/21/18 2317

## 2018-10-21 NOTE — ED Notes (Addendum)
Pt has been in contact with family and provided updates.

## 2018-10-21 NOTE — ED Notes (Signed)
[name] [phone], pts daughter please update

## 2018-10-21 NOTE — ED Triage Notes (Signed)
Per [facility], pt from [employer] w/ a c/o increased stroke like symptoms. Pt had a stroke on 5/19. She had left sided weakness (arm and leg) and facial droop. She was LSN last night at 2030. Her left sided weakness and facial droop were noted to become worse. Additional complaints of a headache that has subsided.   Pt also reports anxiety and emotional needs. Her husband was in hospice care and died last night.   156/100 HR 72 CBG 115 97% RA  20 L hand

## 2018-10-21 NOTE — ED Notes (Signed)
Brittany Osborn, pt's daughter, called and provided status update.

## 2018-10-22 NOTE — ED Provider Notes (Signed)
Care assumed from Dr. Tyrone Nine, patient with left-sided weakness pending MRI scan.  MRI shows no evidence of acute stroke.  She is felt to be safe for discharge.  She is to follow-up with her primary care provider.  Results for orders placed or performed during the hospital encounter of 10/21/18  CBC with Differential  Result Value Ref Range   WBC 4.8 4.0 - 10.5 K/uL   RBC 4.14 3.87 - 5.11 MIL/uL   Hemoglobin 13.3 12.0 - 15.0 g/dL   HCT 40.0 36.0 - 46.0 %   MCV 96.6 80.0 - 100.0 fL   MCH 32.1 26.0 - 34.0 pg   MCHC 33.3 30.0 - 36.0 g/dL   RDW 12.2 11.5 - 15.5 %   Platelets 197 150 - 400 K/uL   nRBC 0.0 0.0 - 0.2 %   Neutrophils Relative % 59 %   Neutro Abs 2.8 1.7 - 7.7 K/uL   Lymphocytes Relative 31 %   Lymphs Abs 1.5 0.7 - 4.0 K/uL   Monocytes Relative 9 %   Monocytes Absolute 0.5 0.1 - 1.0 K/uL   Eosinophils Relative 1 %   Eosinophils Absolute 0.1 0.0 - 0.5 K/uL   Basophils Relative 0 %   Basophils Absolute 0.0 0.0 - 0.1 K/uL   Immature Granulocytes 0 %   Abs Immature Granulocytes 0.00 0.00 - 0.07 K/uL  Basic metabolic panel  Result Value Ref Range   Sodium 136 135 - 145 mmol/L   Potassium 4.1 3.5 - 5.1 mmol/L   Chloride 104 98 - 111 mmol/L   CO2 26 22 - 32 mmol/L   Glucose, Bld 99 70 - 99 mg/dL   BUN 12 8 - 23 mg/dL   Creatinine, Ser 0.77 0.44 - 1.00 mg/dL   Calcium 8.7 (L) 8.9 - 10.3 mg/dL   GFR calc non Af Amer >60 >60 mL/min   GFR calc Af Amer >60 >60 mL/min   Anion gap 6 5 - 15   Ct Angio Head W Or Wo Contrast  Result Date: 10/16/2018 CLINICAL DATA:  LEFT-sided weakness. EXAM: CT ANGIOGRAPHY HEAD AND NECK TECHNIQUE: Multidetector CT imaging of the head and neck was performed using the standard protocol during bolus administration of intravenous contrast. Multiplanar CT image reconstructions and MIPs were obtained to evaluate the vascular anatomy. Carotid stenosis measurements (when applicable) are obtained utilizing NASCET criteria, using the distal internal carotid  diameter as the denominator. CONTRAST:  45mL OMNIPAQUE IOHEXOL 350 MG/ML SOLN COMPARISON:  CT head earlier today. FINDINGS: CTA NECK FINDINGS Aortic arch: Standard branching. Imaged portion shows no evidence of aneurysm or dissection. No significant stenosis of the major arch vessel origins. Mild LEFT subclavian disease is nonstenotic. Aortic atherosclerosis. Right carotid system: No evidence of dissection, stenosis (50% or greater) or occlusion. Left carotid system: No evidence of dissection, stenosis (50% or greater) or occlusion. Vertebral arteries: Codominant. No evidence of dissection, stenosis (50% or greater) or occlusion. Skeleton: Moderate spondylosis. No worrisome osseous finding. Other neck: No neck masses. Upper chest: Mild vascular congestion in the lungs. No pneumothorax. No mass. Review of the MIP images confirms the above findings CTA HEAD FINDINGS Anterior circulation: No significant stenosis, proximal occlusion, aneurysm, or vascular malformation. Posterior circulation: No significant stenosis, proximal occlusion, aneurysm, or vascular malformation. Venous sinuses: As permitted by contrast timing, patent. Anatomic variants: None of significance Delayed phase: Is not performed Review of the MIP images confirms the above findings IMPRESSION: No intracranial or extracranial stenosis of significance. Electronically Signed   By: Jenny Reichmann  Alfonse Flavors M.D.   On: 10/16/2018 15:47   Ct Angio Neck W Or Wo Contrast  Result Date: 10/16/2018 CLINICAL DATA:  LEFT-sided weakness. EXAM: CT ANGIOGRAPHY HEAD AND NECK TECHNIQUE: Multidetector CT imaging of the head and neck was performed using the standard protocol during bolus administration of intravenous contrast. Multiplanar CT image reconstructions and MIPs were obtained to evaluate the vascular anatomy. Carotid stenosis measurements (when applicable) are obtained utilizing NASCET criteria, using the distal internal carotid diameter as the denominator. CONTRAST:   68mL OMNIPAQUE IOHEXOL 350 MG/ML SOLN COMPARISON:  CT head earlier today. FINDINGS: CTA NECK FINDINGS Aortic arch: Standard branching. Imaged portion shows no evidence of aneurysm or dissection. No significant stenosis of the major arch vessel origins. Mild LEFT subclavian disease is nonstenotic. Aortic atherosclerosis. Right carotid system: No evidence of dissection, stenosis (50% or greater) or occlusion. Left carotid system: No evidence of dissection, stenosis (50% or greater) or occlusion. Vertebral arteries: Codominant. No evidence of dissection, stenosis (50% or greater) or occlusion. Skeleton: Moderate spondylosis. No worrisome osseous finding. Other neck: No neck masses. Upper chest: Mild vascular congestion in the lungs. No pneumothorax. No mass. Review of the MIP images confirms the above findings CTA HEAD FINDINGS Anterior circulation: No significant stenosis, proximal occlusion, aneurysm, or vascular malformation. Posterior circulation: No significant stenosis, proximal occlusion, aneurysm, or vascular malformation. Venous sinuses: As permitted by contrast timing, patent. Anatomic variants: None of significance Delayed phase: Is not performed Review of the MIP images confirms the above findings IMPRESSION: No intracranial or extracranial stenosis of significance. Electronically Signed   By: Staci Righter M.D.   On: 10/16/2018 15:47   Mr Brain Wo Contrast  Result Date: 10/21/2018 CLINICAL DATA:  Initial evaluation for focal neural deficit. EXAM: MRI HEAD WITHOUT CONTRAST TECHNIQUE: Multiplanar, multiecho pulse sequences of the brain and surrounding structures were obtained without intravenous contrast. COMPARISON:  Prior MRI from 10/17/2018 FINDINGS: Brain: Generalized age-related cerebral atrophy. Mild chronic micro vessel ischemic changes present within the periventricular white matter. Superimposed remote lacunar infarct at the right frontal corona radiata/centrum semi ovale. No abnormal foci of  restricted diffusion to suggest acute or subacute ischemia. Gray-white matter differentiation maintained. No encephalomalacia to suggest chronic cortical infarction. No foci of susceptibility artifact to suggest acute or chronic intracranial hemorrhage. No mass lesion, midline shift or mass effect. No hydrocephalus. No extra-axial fluid collection. Pituitary gland suprasellar region normal. Vascular: Major intracranial vascular flow voids are maintained. Skull and upper cervical spine: Craniocervical junction within normal limits. Upper cervical spine normal. Bone marrow signal intensity within normal limits. No scalp soft tissue abnormality. Sinuses/Orbits: Patient status post bilateral ocular lens replacement. Chronic right maxillary sinusitis noted. Mild scattered mucosal thickening within the ethmoidal air cells. Trace left mastoid effusion, of doubtful significance. Inner ear structures normal. Other: None. IMPRESSION: 1. No acute intracranial infarct or other abnormality identified. 2. 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 3. Chronic right maxillary sinusitis. Electronically Signed   By: Jeannine Boga M.D.   On: 10/21/2018 23:47   Mr Brain Wo Contrast  Result Date: 10/17/2018 CLINICAL DATA:  LEFT-sided weakness, query ischemic stroke versus complex migraine. Status post tPA. EXAM: MRI HEAD WITHOUT CONTRAST TECHNIQUE: Multiplanar, multiecho pulse sequences of the brain and surrounding structures were obtained without intravenous contrast. COMPARISON:  CTA head neck 10/16/2018 FINDINGS: Brain: No evidence for acute infarction, hemorrhage, mass lesion, hydrocephalus, or extra-axial fluid. Mild atrophy. Mild subcortical and periventricular T2 and FLAIR hyperintensities,  likely chronic microvascular ischemic change. Moderate-sized area of chronic subcortical and periventricular infarction, RIGHT frontal lobe, corresponds with the  observed CT hypodensity. Vascular: Flow voids are maintained. Skull and upper cervical spine: Negative. Sinuses/Orbits: Chronic RIGHT maxillary sinusitis.  Negative orbits. Other: Small nasopharyngeal Thornwaldt cyst. IMPRESSION: Atrophy and small vessel disease. Chronic RIGHT frontal white matter infarct. No acute intracranial findings. No concerning areas of susceptibility to suggest post tPA hemorrhage. No extra-axial collections. Electronically Signed   By: Staci Righter M.D.   On: 10/17/2018 13:23   Ct Head Code Stroke Wo Contrast  Result Date: 10/16/2018 CLINICAL DATA:  Code stroke.  Left-sided weakness EXAM: CT HEAD WITHOUT CONTRAST TECHNIQUE: Contiguous axial images were obtained from the base of the skull through the vertex without intravenous contrast. COMPARISON:  None. FINDINGS: Brain: Subtle hypodensity in the right posterior frontal white matter. This may represent acute infarction. Otherwise no acute infarct, hemorrhage, mass. Ventricle size is normal. Vascular: Negative for hyperdense vessel Skull: Negative Sinuses/Orbits: Mucoperiosteal thickening right maxillary sinus. Remaining sinuses clear. Bilateral cataract surgery. Other: None ASPECTS (Melissa Stroke Program Early CT Score) - Ganglionic level infarction (caudate, lentiform nuclei, internal capsule, insula, M1-M3 cortex): 7 - Supraganglionic infarction (M4-M6 cortex): 3 Total score (0-10 with 10 being normal): 10 IMPRESSION: 1. Hypodensity right posterior frontal lobe may represent acute infarct. This appears to be predominantly in the white matter therefore no points adducted on aspect score. 2. ASPECTS is 10 Electronically Signed   By: Franchot Gallo M.D.   On: 58/01/9832 82:50      Delora Fuel, MD 53/97/67 [phone]

## 2018-10-22 NOTE — Discharge Instructions (Addendum)
Continue working with physical therapy.  Return if symptoms are getting worse.

## 2018-10-23 DIAGNOSIS — M1990 Unspecified osteoarthritis, unspecified site: Secondary | ICD-10-CM | POA: Diagnosis not present

## 2018-10-23 DIAGNOSIS — K219 Gastro-esophageal reflux disease without esophagitis: Secondary | ICD-10-CM | POA: Diagnosis not present

## 2018-10-23 DIAGNOSIS — I69354 Hemiplegia and hemiparesis following cerebral infarction affecting left non-dominant side: Secondary | ICD-10-CM | POA: Diagnosis not present

## 2018-10-23 DIAGNOSIS — I10 Essential (primary) hypertension: Secondary | ICD-10-CM | POA: Diagnosis not present

## 2018-10-24 ENCOUNTER — Other Ambulatory Visit: Payer: Self-pay

## 2018-10-24 ENCOUNTER — Telehealth: Payer: Self-pay | Admitting: *Deleted

## 2018-10-24 ENCOUNTER — Encounter: Payer: Self-pay | Admitting: Internal Medicine

## 2018-10-24 ENCOUNTER — Ambulatory Visit (INDEPENDENT_AMBULATORY_CARE_PROVIDER_SITE_OTHER): Payer: Medicare Other | Admitting: Internal Medicine

## 2018-10-24 VITALS — BP 133/62 | HR 64 | Temp 98.8°F | Ht 62.0 in | Wt 156.8 lb

## 2018-10-24 DIAGNOSIS — F4321 Adjustment disorder with depressed mood: Secondary | ICD-10-CM | POA: Diagnosis not present

## 2018-10-24 MED ORDER — DOXEPIN HCL 3 MG PO TABS: 3.0000 mg | ORAL_TABLET | Freq: Every evening | ORAL | 1 refills | Status: DC | PRN

## 2018-10-24 NOTE — Telephone Encounter (Signed)
Pt's daughter calls and states pt is very anxious and not doing well. States the death of pt's husband is causing her great distress. appt 1100 5/27

## 2018-10-24 NOTE — Patient Instructions (Addendum)
Thank you for coming to the clinic today. It was a pleasure to see you.   Please contact hospice about grief counseling or call our office to speak with Dessie Coma.  Use this prescription for doxepin as needed at bedtime it will help you sleep   Please call the internal medicine center clinic if you have any questions or concerns, we may be able to help and keep you from a long and expensive emergency room wait. Our clinic and after hours phone number is [phone], the best time to call is Monday through Friday 9 am to 4 pm but there is always someone available 24/7 if you have an emergency. If you need medication refills please notify your pharmacy one week in advance and they will send [place] a request.    Coping With Loss, Adult People experience loss in many different ways throughout their lives. Events such as moving, changing jobs, and losing friends can create a sense of loss. The loss may be as serious as a major health change, divorce, death of a pet, or death of a loved one. All of these types of loss are likely to create a physical and emotional reaction known as grief. Grief is the result of a major change or an absence of something or someone that you count on. Grief is a normal reaction to loss. How to recognize changes A variety of factors can affect your grieving experience, including:  The nature of your loss.  Your relationship to what or whom you lost.  Your understanding of grief and how to cope with it.  Your support system. The way that you deal with your grief will affect your ability to function as you normally do. When you are grieving, you may experience:  Numbness, shock, sadness, anxiety, anger, denial, and guilt.  Thoughts about death.  Unexpected crying.  A physical sensation of emptiness in your gut.  Problems sleeping and eating.  Fatigue.  Loss of interest in normal activities.  Dreaming about or imagining seeing the person who died.  A need to  remember what or whom you lost.  Difficulty thinking about anything other than your loss for a period of time.  Relief. If you have been expecting the loss for a while, you may feel a sense of relief when it happens. Where to find support To get support for coping with loss:  Ask your health care provider for help and recommendations, such as grief counseling or therapy.  Think about joining a support group for people who are coping with loss. Follow these instructions at home:   Be patient with yourself and others. Allow the grieving process to happen, and remember that grieving takes time. ? It is likely that you may never feel completely done with some grief. You may find a way to move on while still cherishing memories and feelings about your loss. ? Accepting your loss is a process. It can take months or longer to adjust.  Express your feelings in healthy ways, such as: ? Talking with others about your loss. It may be helpful to find others who have had a similar loss, such as a support group. ? Writing down your feelings in a journal. ? Doing physical activities to release stress and emotional energy. ? Doing creative activities like painting, sculpting, or playing or listening to music. ? Practicing resilience. This is the ability to recover and adjust after facing challenges. Reading some resources that encourage resilience may help you to learn  ways to practice those behaviors.  Keep to your normal routine as much as possible. If you have trouble focusing or doing normal activities, it is acceptable to take some time away from your normal routine.  Spend time with friends and loved ones.  Eat a healthy diet, get plenty of sleep, and rest when you feel tired. Where to find more information You can find more information about coping with loss from:  American Society of Clinical Oncology: www.cancer.net  American Psychological Association: TVStereos.ch Contact a health care  provider if:  Your grief is extreme and keeps getting worse.  You have ongoing grief that does not improve.  Your body shows symptoms of grief, such as illness.  You feel depressed, anxious, or lonely. Get help right away if:  You have thoughts about hurting yourself or others. If you ever feel like you may hurt yourself or others, or have thoughts about taking your own life, get help right away. You can go to your nearest emergency department or call:  Your local emergency services (911 in the [place]  A suicide crisis helpline, such as the [place] at (907)584-0663. This is open 24 hours a day. Summary  Grief is a normal part of experiencing a loss. It is the result of a major change or an absence of something or someone that you count on.  The depth of grief and the period of recovery depend on the type of loss as well as your ability to adjust to the change and process your feelings.  Processing grief requires patience and a willingness to accept your feelings and talk about your loss with people who are supportive.  It is important to find resources that work for you and to realize that we are all different when it comes to grief. There is not one single grieving process that works for everyone in the same way.  Be aware that when grief becomes extreme, it can lead to more severe issues like isolation, depression, anxiety, or suicidal thoughts. Talk with your health care provider if you have any of these issues. This information is not intended to replace advice given to you by your health care provider. Make sure you discuss any questions you have with your health care provider. Document Released: 09/29/2016 Document Revised: 09/29/2016 Document Reviewed: 09/29/2016 Elsevier Interactive Patient Education  2019 [employer].

## 2018-10-24 NOTE — Assessment & Plan Note (Signed)
Patient presents for evaluation of anxiety which has worsened since the loss of her husband four days ago and recent stroke. She describes being reminded of her husband by her surroundings, feeling waves of sadness and crying, difficulties with sleeping, depression. She has been managing the insomnia melatonin but finds that there are times when she has difficulty sleeping despite this. She denies any significant anxiety or depression prior to the loss of her husband. She denies suicidal ideations. She has not spoken with behavioral health but is interested in trying this.   Assessment: Uncomplicated grief  - referral to in office behavioral health with Dessie Coma for grief counseling  - trial of doxepin PRN for sleep  - follow up in two weeks

## 2018-10-24 NOTE — Progress Notes (Signed)
   CC: anxiety  HPI:  [name] is a 73 y.o. with PMH as listed below who presents for anxiety. Please see the assessment and plans for the status of the patient chronic medical problems.   Past Medical History:  Diagnosis Date  . Anxiety   . Arthritis   . Cataracts, bilateral   . GERD (gastroesophageal reflux disease)   . H/O hiatal hernia   . Headache(784.0)   . Stroke [facility])    Review of Systems:  Refer to history of present illness and assessment and plans for pertinent review of systems, all others reviewed and negative  Physical Exam:  Vitals:   10/24/18 1111  BP: 133/62  Pulse: 64  Temp: 98.8 F (37.1 C)  TempSrc: Oral  SpO2: 98%  Weight: 156 lb 12.8 oz (71.1 kg)  Height: 5\' 2"  (1.575 m)   General: no acute distress Eyes: no conjunctivitis, no jaundice  Cardiac: regular rate and rhythm, no murmurs, rubs, or gallops, no peripheral edema  Pulm: lungs are clear to auscultation, no wheezes or rhonchi  GI: the abdomen is soft, non tender Skin: no rashes evident on the exposed skin   Assessment & Plan:   Grief   Patient presents for evaluation of anxiety which has worsened since the loss of her husband four days ago and recent stroke. She describes being reminded of her husband by her surroundings, feeling waves of sadness and crying, difficulties with sleeping, depression. She has been managing the insomnia melatonin but finds that there are times when she has difficulty sleeping despite this. She denies any significant anxiety or depression prior to the loss of her husband. She denies suicidal ideations. She has not spoken with behavioral health but is interested in trying this.   Assessment: Uncomplicated grief  - referral to in office behavioral health with Brittany Osborn for grief counseling  - trial of doxepin PRN for sleep  - follow up scheduled for June 15th with PCP   See Encounters Tab for problem based charting.  Patient discussed with Dr. Angelia Osborn

## 2018-10-25 ENCOUNTER — Telehealth: Payer: Self-pay | Admitting: Licensed Clinical Social Worker

## 2018-10-25 DIAGNOSIS — I69354 Hemiplegia and hemiparesis following cerebral infarction affecting left non-dominant side: Secondary | ICD-10-CM | POA: Diagnosis not present

## 2018-10-25 DIAGNOSIS — I10 Essential (primary) hypertension: Secondary | ICD-10-CM | POA: Diagnosis not present

## 2018-10-25 DIAGNOSIS — M1990 Unspecified osteoarthritis, unspecified site: Secondary | ICD-10-CM | POA: Diagnosis not present

## 2018-10-25 DIAGNOSIS — K219 Gastro-esophageal reflux disease without esophagitis: Secondary | ICD-10-CM | POA: Diagnosis not present

## 2018-10-25 NOTE — Telephone Encounter (Signed)
Patient was contacted to scheduled an appointment due to the referral from her PCP. Patient did not answer, and a voicemail was left for the patient to contact our office to schedule a future appointment.

## 2018-10-29 DIAGNOSIS — I69354 Hemiplegia and hemiparesis following cerebral infarction affecting left non-dominant side: Secondary | ICD-10-CM | POA: Diagnosis not present

## 2018-10-29 DIAGNOSIS — M1990 Unspecified osteoarthritis, unspecified site: Secondary | ICD-10-CM | POA: Diagnosis not present

## 2018-10-29 DIAGNOSIS — I10 Essential (primary) hypertension: Secondary | ICD-10-CM | POA: Diagnosis not present

## 2018-10-29 DIAGNOSIS — K219 Gastro-esophageal reflux disease without esophagitis: Secondary | ICD-10-CM | POA: Diagnosis not present

## 2018-10-30 DIAGNOSIS — I69354 Hemiplegia and hemiparesis following cerebral infarction affecting left non-dominant side: Secondary | ICD-10-CM | POA: Diagnosis not present

## 2018-10-30 DIAGNOSIS — I10 Essential (primary) hypertension: Secondary | ICD-10-CM | POA: Diagnosis not present

## 2018-10-30 DIAGNOSIS — M1990 Unspecified osteoarthritis, unspecified site: Secondary | ICD-10-CM | POA: Diagnosis not present

## 2018-10-30 DIAGNOSIS — K219 Gastro-esophageal reflux disease without esophagitis: Secondary | ICD-10-CM | POA: Diagnosis not present

## 2018-10-30 NOTE — Progress Notes (Signed)
Internal Medicine Clinic Attending  Case discussed with Dr. Blum at the time of the visit.  We reviewed the resident's history and exam and pertinent patient test results.  I agree with the assessment, diagnosis, and plan of care documented in the resident's note. 

## 2018-10-31 DIAGNOSIS — I69354 Hemiplegia and hemiparesis following cerebral infarction affecting left non-dominant side: Secondary | ICD-10-CM | POA: Diagnosis not present

## 2018-10-31 DIAGNOSIS — M1990 Unspecified osteoarthritis, unspecified site: Secondary | ICD-10-CM | POA: Diagnosis not present

## 2018-10-31 DIAGNOSIS — I10 Essential (primary) hypertension: Secondary | ICD-10-CM | POA: Diagnosis not present

## 2018-10-31 DIAGNOSIS — K219 Gastro-esophageal reflux disease without esophagitis: Secondary | ICD-10-CM | POA: Diagnosis not present

## 2018-11-06 DIAGNOSIS — I10 Essential (primary) hypertension: Secondary | ICD-10-CM | POA: Diagnosis not present

## 2018-11-06 DIAGNOSIS — I69354 Hemiplegia and hemiparesis following cerebral infarction affecting left non-dominant side: Secondary | ICD-10-CM | POA: Diagnosis not present

## 2018-11-06 DIAGNOSIS — K219 Gastro-esophageal reflux disease without esophagitis: Secondary | ICD-10-CM | POA: Diagnosis not present

## 2018-11-06 DIAGNOSIS — M1990 Unspecified osteoarthritis, unspecified site: Secondary | ICD-10-CM | POA: Diagnosis not present

## 2018-11-07 DIAGNOSIS — Z8719 Personal history of other diseases of the digestive system: Secondary | ICD-10-CM | POA: Diagnosis not present

## 2018-11-07 DIAGNOSIS — I63419 Cerebral infarction due to embolism of unspecified middle cerebral artery: Secondary | ICD-10-CM | POA: Diagnosis not present

## 2018-11-07 DIAGNOSIS — I10 Essential (primary) hypertension: Secondary | ICD-10-CM | POA: Diagnosis not present

## 2018-11-07 DIAGNOSIS — E7800 Pure hypercholesterolemia, unspecified: Secondary | ICD-10-CM | POA: Insufficient documentation

## 2018-11-07 DIAGNOSIS — K219 Gastro-esophageal reflux disease without esophagitis: Secondary | ICD-10-CM | POA: Diagnosis not present

## 2018-11-07 DIAGNOSIS — I69354 Hemiplegia and hemiparesis following cerebral infarction affecting left non-dominant side: Secondary | ICD-10-CM | POA: Diagnosis not present

## 2018-11-07 DIAGNOSIS — M1990 Unspecified osteoarthritis, unspecified site: Secondary | ICD-10-CM | POA: Diagnosis not present

## 2018-11-12 ENCOUNTER — Encounter: Payer: Medicare Other | Admitting: Internal Medicine

## 2018-11-12 DIAGNOSIS — I10 Essential (primary) hypertension: Secondary | ICD-10-CM | POA: Diagnosis not present

## 2018-11-12 DIAGNOSIS — K219 Gastro-esophageal reflux disease without esophagitis: Secondary | ICD-10-CM | POA: Diagnosis not present

## 2018-11-12 DIAGNOSIS — M1990 Unspecified osteoarthritis, unspecified site: Secondary | ICD-10-CM | POA: Diagnosis not present

## 2018-11-12 DIAGNOSIS — I69354 Hemiplegia and hemiparesis following cerebral infarction affecting left non-dominant side: Secondary | ICD-10-CM | POA: Diagnosis not present

## 2018-11-13 ENCOUNTER — Encounter: Payer: Self-pay | Admitting: Licensed Clinical Social Worker

## 2018-11-13 ENCOUNTER — Telehealth: Payer: Self-pay | Admitting: Licensed Clinical Social Worker

## 2018-11-13 NOTE — Telephone Encounter (Signed)
Patient was contacted (2nd attempt) to schedule an appointment due to a referral we received from her PCP. A letter will also be mailed today to set up an appointment.

## 2018-11-14 DIAGNOSIS — I10 Essential (primary) hypertension: Secondary | ICD-10-CM | POA: Diagnosis not present

## 2018-11-14 DIAGNOSIS — I69354 Hemiplegia and hemiparesis following cerebral infarction affecting left non-dominant side: Secondary | ICD-10-CM | POA: Diagnosis not present

## 2018-11-14 DIAGNOSIS — M1990 Unspecified osteoarthritis, unspecified site: Secondary | ICD-10-CM | POA: Diagnosis not present

## 2018-11-14 DIAGNOSIS — K219 Gastro-esophageal reflux disease without esophagitis: Secondary | ICD-10-CM | POA: Diagnosis not present

## 2018-11-21 DIAGNOSIS — I69354 Hemiplegia and hemiparesis following cerebral infarction affecting left non-dominant side: Secondary | ICD-10-CM | POA: Diagnosis not present

## 2018-11-21 DIAGNOSIS — I10 Essential (primary) hypertension: Secondary | ICD-10-CM | POA: Diagnosis not present

## 2018-11-21 DIAGNOSIS — K219 Gastro-esophageal reflux disease without esophagitis: Secondary | ICD-10-CM | POA: Diagnosis not present

## 2018-11-21 DIAGNOSIS — M1990 Unspecified osteoarthritis, unspecified site: Secondary | ICD-10-CM | POA: Diagnosis not present

## 2018-11-26 ENCOUNTER — Encounter: Payer: Self-pay | Admitting: Internal Medicine

## 2018-11-26 ENCOUNTER — Encounter: Payer: Medicare Other | Admitting: Internal Medicine

## 2018-11-26 NOTE — Progress Notes (Deleted)
   CC: ***  HPI:  [name] is a 73 y.o.  with a PMH listed below presenting for ***   Headaches, hypertension,  Patient was hospitalized in May 2024 left leg weakness, left arm numbness, and left arm weakness.  She received TPA CT head showed a hypodensity in the right posterior frontal lobe.  Discharged home on aspirin and Plavix x3 weeks and Plavix alone.    Please see A&P for status of the patient's chronic medical conditions  Past Medical History:  Diagnosis Date  . Anxiety   . Arthritis   . Cataracts, bilateral   . GERD (gastroesophageal reflux disease)   . H/O hiatal hernia   . Headache(784.0)   . Stroke [facility])    Review of Systems: Refer to history of present illness and assessment and plans for pertinent review of systems, all others reviewed and negative.  Physical Exam:  There were no vitals filed for this visit. *** Physical Exam  Social History   Socioeconomic History  . Marital status: Married    Spouse name: Not on file  . Number of children: Not on file  . Years of education: Not on file  . Highest education level: Not on file  Occupational History  . Not on file  Social Needs  . Financial resource strain: Not on file  . Food insecurity    Worry: Not on file    Inability: Not on file  . Transportation needs    Medical: Not on file    Non-medical: Not on file  Tobacco Use  . Smoking status: Never Smoker  . Smokeless tobacco: Never Used  Substance and Sexual Activity  . Alcohol use: No  . Drug use: No  . Sexual activity: Not on file  Lifestyle  . Physical activity    Days per week: Not on file    Minutes per session: Not on file  . Stress: Not on file  Relationships  . Social Herbalist on phone: Not on file    Gets together: Not on file    Attends religious service: Not on file    Active member of club or organization: Not on file    Attends meetings of clubs or organizations: Not on file    Relationship status: Not  on file  . Intimate partner violence    Fear of current or ex partner: Not on file    Emotionally abused: Not on file    Physically abused: Not on file    Forced sexual activity: Not on file  Other Topics Concern  . Not on file  Social History Narrative   Patietn lives with husband and son. She worked as an Optometrist and a Oceanographer, retired 10 years ago.    *** Family History  Problem Relation Age of Onset  . Heart disease Mother   . Lung cancer Father     Assessment & Plan:   See Encounters Tab for problem based charting.  Patient {GC/GE:[id number]::"discussed with","seen with"} Dr. {NAMES:[id number]::"Butcher","Granfortuna","E. Hoffman","Klima","Mullen","Narendra","Raines","Vincent"}

## 2018-11-28 ENCOUNTER — Telehealth: Payer: Self-pay

## 2018-11-28 ENCOUNTER — Ambulatory Visit: Payer: Self-pay | Admitting: Neurology

## 2018-11-28 NOTE — Telephone Encounter (Signed)
Patient no show for appt today. 

## 2018-12-05 DIAGNOSIS — R1310 Dysphagia, unspecified: Secondary | ICD-10-CM | POA: Diagnosis not present

## 2018-12-06 ENCOUNTER — Other Ambulatory Visit: Payer: Self-pay | Admitting: Surgery

## 2018-12-06 DIAGNOSIS — R1319 Other dysphagia: Secondary | ICD-10-CM

## 2018-12-06 DIAGNOSIS — R1310 Dysphagia, unspecified: Secondary | ICD-10-CM

## 2018-12-10 DIAGNOSIS — R5381 Other malaise: Secondary | ICD-10-CM | POA: Diagnosis not present

## 2018-12-10 DIAGNOSIS — E7800 Pure hypercholesterolemia, unspecified: Secondary | ICD-10-CM | POA: Diagnosis not present

## 2018-12-10 DIAGNOSIS — I639 Cerebral infarction, unspecified: Secondary | ICD-10-CM | POA: Diagnosis not present

## 2018-12-10 DIAGNOSIS — I63419 Cerebral infarction due to embolism of unspecified middle cerebral artery: Secondary | ICD-10-CM | POA: Diagnosis not present

## 2018-12-10 DIAGNOSIS — I10 Essential (primary) hypertension: Secondary | ICD-10-CM | POA: Diagnosis not present

## 2018-12-11 ENCOUNTER — Ambulatory Visit
Admission: RE | Admit: 2018-12-11 | Discharge: 2018-12-11 | Disposition: A | Discharge status: Home / Self Care | Payer: Medicare Other | Source: Ambulatory Visit | Attending: Surgery | Admitting: Surgery

## 2018-12-11 ENCOUNTER — Other Ambulatory Visit: Payer: Self-pay

## 2018-12-11 DIAGNOSIS — R1319 Other dysphagia: Secondary | ICD-10-CM

## 2018-12-11 DIAGNOSIS — R1310 Dysphagia, unspecified: Secondary | ICD-10-CM

## 2018-12-11 DIAGNOSIS — K449 Diaphragmatic hernia without obstruction or gangrene: Secondary | ICD-10-CM | POA: Diagnosis not present

## 2018-12-11 DIAGNOSIS — K222 Esophageal obstruction: Secondary | ICD-10-CM | POA: Diagnosis not present

## 2018-12-11 NOTE — Addendum Note (Signed)
Addended by: Hulan Fray on: 12/11/2018 09:36 AM   Modules accepted: Orders

## 2018-12-19 ENCOUNTER — Other Ambulatory Visit (HOSPITAL_COMMUNITY): Payer: Self-pay | Admitting: Surgery

## 2018-12-28 DIAGNOSIS — I63419 Cerebral infarction due to embolism of unspecified middle cerebral artery: Secondary | ICD-10-CM | POA: Diagnosis not present

## 2018-12-28 DIAGNOSIS — E7800 Pure hypercholesterolemia, unspecified: Secondary | ICD-10-CM | POA: Diagnosis not present

## 2018-12-28 DIAGNOSIS — I10 Essential (primary) hypertension: Secondary | ICD-10-CM | POA: Diagnosis not present

## 2019-01-07 ENCOUNTER — Encounter: Payer: Medicare Other | Admitting: Internal Medicine

## 2019-01-18 ENCOUNTER — Other Ambulatory Visit: Payer: Self-pay

## 2019-01-18 NOTE — Patient Outreach (Signed)
First attempt to obtain mRs. No answer. Left message for return call.  

## 2019-01-22 ENCOUNTER — Other Ambulatory Visit: Payer: Self-pay

## 2019-01-22 NOTE — Patient Outreach (Signed)
Second attempt to obtain mRs. No answer. Left message for return call.   No DPR on file.

## 2019-01-29 ENCOUNTER — Other Ambulatory Visit: Payer: Self-pay

## 2019-01-29 NOTE — Patient Outreach (Signed)
3 outreach attempts were completed to obtain mRs. mRs could not be obtained because patient never returned my calls. mRs=7 

## 2019-02-18 ENCOUNTER — Other Ambulatory Visit (HOSPITAL_COMMUNITY)
Admission: RE | Admit: 2019-02-18 | Discharge: 2019-02-18 | Disposition: A | Discharge status: Home / Self Care | Payer: Medicare Other | Source: Ambulatory Visit | Attending: Surgery | Admitting: Surgery

## 2019-02-18 DIAGNOSIS — Z20828 Contact with and (suspected) exposure to other viral communicable diseases: Secondary | ICD-10-CM | POA: Diagnosis not present

## 2019-02-18 DIAGNOSIS — Z01812 Encounter for preprocedural laboratory examination: Secondary | ICD-10-CM | POA: Diagnosis not present

## 2019-02-19 ENCOUNTER — Encounter (HOSPITAL_COMMUNITY): Payer: Self-pay | Admitting: *Deleted

## 2019-02-19 ENCOUNTER — Other Ambulatory Visit: Payer: Self-pay

## 2019-02-19 NOTE — H&P (Signed)
Brittany Osborn  Location: [facility] Patient #: [id number] DOB: 1946/04/28 Widowed / Language: Brittany Osborn / Race: White Female  History of Present Illness   The patient is a 73 year old female who presents with a complaint of hernia.  The PCP is [facility] residency program (Dr. Gilles Chiquito) She is by herself. Her daughter is in the hall.  [The Covid-19 virus has disrupted normal medical care in [place] and across the nation. We have sometimes had to alter normal surgical/medical care to limit this epidemic and we have explained these changes to the patient.]  I took care of [name] in 11/12/2012 for a large hiatal hernia. The last time I saw her was 06/20/2013. [name] went through a fairly traumatic 2018-11-05. Her husband died in 11-05-22 of vascular dementia. Some time this spring, predating her husband's death, she started having increasing dysphagia. Particularly with breads or heavy food, she felt that certain foods would not get down. Since her husband's death, she is estimated she vomits about once a week. She's guessing that she has lost about 20 pounds. Of note, when I did her surgery - she weighed 128 pounds. She now weighs 151 pounds. 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 She said that she passed out going to the hospital. A head CT scan on 10/16/2018 showed hypodensity in the right posterior frontal lobe. She was hospitalized from 19 May - 18 Oct 2018 and treated with tPA. She still has some left left weakness. She walks with a cane. She is right handed. She was supposed to follow up with Dr. Leonie Man, but has not done that yet  She's here to see to evaluate her dysphagia.  Review of Systems as stated in this history (HPI) or in the review of systems. Otherwise all other 12 point ROS are negative  Past Medical History: 1. She underwent a repair of a HH with Nissen fundoplication -  [id number] - [place] She had a colonoscopy around the same time. She commented that she did not want to see Dr. Benson Norway back again. 2. Arthritis - hands and legs 3. Anxiety 4. Bronchitis since she was a child This resolved with her [facility] repair and has not come back since. 5. Stroke - 11-05-18 Treated with tPA She was supposed to see Dr. Leonie Man back, but has not been able to follow up on that appt yet.  Social History: Widowed. Husband Brittany Osborn (DOB - 10/09/1939) - I did hernia surgery on him. He died of vascular dementia in 2018-11-05. She had a stroke about the same time. They had been married 36 years. She lives with her daughter - Brittany Osborn Brittany Osborn is married) She has a son who is a Administrator.  Past Surgical History [name], [place]; 12/05/2018 9:35 AM) Cesarean Section - 1  Oral Surgery   Diagnostic Studies History [name], [name]; 12/05/2018 9:35 AM) Colonoscopy  5-10 years ago Mammogram  >3 years ago Pap Smear  1-5 years ago  Medication History [name], [name]; 12/05/2018 9:38 AM) Atorvastatin Calcium (20MG  Tablet, Oral) Active. Plavix (75MG  Tablet, Oral) Active. Medications Reconciled  Social History [name], [name]; 12/05/2018 9:35 AM) Caffeine use  Carbonated beverages, Coffee, Tea. No alcohol use  No drug use  Tobacco use  Never smoker.  Family History [name], [name]; 12/05/2018 9:35 AM) Alcohol Abuse  Brother, Father, Mother, Sister. Heart Disease  Mother. Respiratory Condition  Father.  Pregnancy /  Birth History [name], [name]; 12/05/2018 9:35 AM) Age at menarche  15 years. Age of menopause  [age] Contraceptive History  Oral contraceptives. Gravida  2 Irregular periods  Maternal age  80-25 Para  2  Other Problems [name], [name]; 12/05/2018 9:35 AM) Anxiety Disorder  Arthritis  Cerebrovascular Accident  Depression  Gastroesophageal Reflux Disease   Hemorrhoids     Review of Systems [name] [name]; 12/05/2018 9:35 AM) General Present- Fatigue and Night Sweats. Not Present- Appetite Loss, Chills, Fever, Weight Gain and Weight Loss. Skin Not Present- Change in Wart/Mole, Dryness, Hives, Jaundice, New Lesions, Non-Healing Wounds, Rash and Ulcer. HEENT Present- Hearing Loss, Seasonal Allergies and Wears glasses/contact lenses. Not Present- Earache, Hoarseness, Nose Bleed, Oral Ulcers, Ringing in the Ears, Sinus Pain, Sore Throat, Visual Disturbances and Yellow Eyes. Respiratory Present- Snoring. Not Present- Bloody sputum, Chronic Cough, Difficulty Breathing and Wheezing. Breast Not Present- Breast Mass, Breast Pain, Nipple Discharge and Skin Changes. Cardiovascular Present- Swelling of Extremities. Not Present- Chest Pain, Difficulty Breathing Lying Down, Leg Cramps, Palpitations, Rapid Heart Rate and Shortness of Breath. Gastrointestinal Present- Abdominal Pain, Constipation, Difficulty Swallowing, Hemorrhoids and Vomiting. Not Present- Bloating, Bloody Stool, Change in Bowel Habits, Chronic diarrhea, Excessive gas, Gets full quickly at meals, Indigestion, Nausea and Rectal Pain. Hematology Present- Blood Thinners and Easy Bruising. Not Present- Excessive bleeding, Gland problems, HIV and Persistent Infections.  Vitals [name] [name]; 12/05/2018 9:38 AM) 12/05/2018 9:34 AM Weight: 151.25 lb Height: 62in Body Surface Area: 1.7 m Body Mass Index: 27.66 kg/m  Temp.: 98.25F (Oral)  Pulse: 70 (Regular)  BP: 126/84(Sitting, Left Arm, Standard)  Physical exam: General: WN F who is alert and generally healthy appearing.  HEENT: Normal. Pupils equal. Good dentition. Neck: Supple. No mass.  No thyroid mass.  Carotid pulse okay with no bruit. Lymph Nodes:  No supraclavicular or cervical nodes.  Lungs: Clear to auscultation and symmetric breath sounds. Heart:  RRR. No murmur or rub. Abdomen: Soft. No mass. No tenderness. No  hernia. Normal bowel sounds.    Extremities:  Good strength and ROM  in upper and lower extremities. Neurologic:  Grossly intact to motor and sensory function. Psychiatric: Has normal mood and affect. Behavior is normal.   Assessment & Plan  1.  HISTORY OF NISSEN FUNDOPLICATION (0000000)  Story: HH repair and Nissen fundoplication - [id number]  GI - Benson Norway She had a colonoscopy around the same time. She commented that she did not want to see Dr. Benson Norway back again.  2.  ESOPHAGEAL DYSPHAGIA (R13.10)  Plan:  1) UGI Addendum Note( H. Lucia Gaskins MD; 12/19/2018 3:19 PM)   UGI - 12/11/2018 -1. Distal esophageal stricture obstructs passage of a 13 mm barium tablet. Stricture has smooth but relatively abrupt shoulders and appearance favors benign etiology but upper endoscopy could be used to exclude neoplasm.   2. Nonspecific esophageal motility disorder.   3. Small hiatal hernia.  2) Will plan upper endoscopy.  3. Arthritis - hands and legs 4. Anxiety 5. Bronchitis since she was a child This resolved with her [facility] repair and has not come back since. 6. Stroke - May 2020 Treated with tPA She was supposed to see Dr. Leonie Man back, but has not been able to follow up on that appt yet. 7.  Anticoagulated  On Plavix       to hold for 5 days   Alphonsa Overall, MD, [facility] Pager: [phone] Office phone:  [phone]

## 2019-02-20 LAB — NOVEL CORONAVIRUS, NAA (HOSP ORDER, SEND-OUT TO REF LAB; TAT 18-24 HRS): SARS-CoV-2, NAA: NOT DETECTED

## 2019-02-21 ENCOUNTER — Ambulatory Visit (HOSPITAL_COMMUNITY): Payer: Medicare Other | Admitting: Certified Registered"

## 2019-02-21 ENCOUNTER — Encounter (HOSPITAL_COMMUNITY)
Admission: RE | Disposition: A | Discharge status: Home / Self Care | Payer: Self-pay | Source: Home / Self Care | Attending: Surgery

## 2019-02-21 ENCOUNTER — Encounter (HOSPITAL_COMMUNITY): Payer: Self-pay | Admitting: Certified Registered"

## 2019-02-21 ENCOUNTER — Ambulatory Visit (HOSPITAL_COMMUNITY)
Admission: RE | Admit: 2019-02-21 | Discharge: 2019-02-21 | Disposition: A | Discharge status: Home / Self Care | Payer: Medicare Other | Attending: Surgery | Admitting: Surgery

## 2019-02-21 ENCOUNTER — Other Ambulatory Visit: Payer: Self-pay

## 2019-02-21 DIAGNOSIS — Z7902 Long term (current) use of antithrombotics/antiplatelets: Secondary | ICD-10-CM | POA: Diagnosis not present

## 2019-02-21 DIAGNOSIS — M19041 Primary osteoarthritis, right hand: Secondary | ICD-10-CM | POA: Insufficient documentation

## 2019-02-21 DIAGNOSIS — K449 Diaphragmatic hernia without obstruction or gangrene: Secondary | ICD-10-CM | POA: Insufficient documentation

## 2019-02-21 DIAGNOSIS — M19042 Primary osteoarthritis, left hand: Secondary | ICD-10-CM | POA: Insufficient documentation

## 2019-02-21 DIAGNOSIS — K210 Gastro-esophageal reflux disease with esophagitis: Secondary | ICD-10-CM | POA: Diagnosis not present

## 2019-02-21 DIAGNOSIS — I10 Essential (primary) hypertension: Secondary | ICD-10-CM | POA: Diagnosis not present

## 2019-02-21 DIAGNOSIS — I69354 Hemiplegia and hemiparesis following cerebral infarction affecting left non-dominant side: Secondary | ICD-10-CM | POA: Diagnosis not present

## 2019-02-21 DIAGNOSIS — Z79899 Other long term (current) drug therapy: Secondary | ICD-10-CM | POA: Diagnosis not present

## 2019-02-21 HISTORY — PX: BIOPSY: SHX5522

## 2019-02-21 HISTORY — DX: Other complications of anesthesia, initial encounter: T88.59XA

## 2019-02-21 HISTORY — PX: ESOPHAGOGASTRODUODENOSCOPY: SHX5428

## 2019-02-21 HISTORY — DX: Family history of other specified conditions: Z84.89

## 2019-02-21 HISTORY — DX: Essential (primary) hypertension: I10

## 2019-02-21 LAB — CLOTEST (H. PYLORI), BIOPSY: Helicobacter screen: POSITIVE — AB

## 2019-02-21 SURGERY — EGD (ESOPHAGOGASTRODUODENOSCOPY)
Anesthesia: Monitor Anesthesia Care

## 2019-02-21 MED ORDER — PROPOFOL 10 MG/ML IV BOLUS
INTRAVENOUS | Status: AC
  Filled 2019-02-21: qty 20

## 2019-02-21 MED ORDER — SODIUM CHLORIDE 0.9 % IV SOLN: INTRAVENOUS | Status: DC

## 2019-02-21 MED ORDER — PANTOPRAZOLE SODIUM 40 MG PO TBEC: 40.0000 mg | DELAYED_RELEASE_TABLET | Freq: Every day | ORAL | 2 refills | Status: DC

## 2019-02-21 MED ORDER — LACTATED RINGERS IV SOLN
INTRAVENOUS | Status: AC | PRN
  Administered 2019-02-21: 1000 mL via INTRAVENOUS

## 2019-02-21 MED ORDER — PROPOFOL 500 MG/50ML IV EMUL
INTRAVENOUS | Status: DC | PRN
  Administered 2019-02-21: 250 ug/kg/min via INTRAVENOUS

## 2019-02-21 MED ORDER — PROPOFOL 10 MG/ML IV BOLUS
INTRAVENOUS | Status: AC
  Filled 2019-02-21: qty 80

## 2019-02-21 MED ORDER — ONDANSETRON HCL 4 MG/2ML IJ SOLN
INTRAMUSCULAR | Status: DC | PRN
  Administered 2019-02-21: 4 mg via INTRAVENOUS

## 2019-02-21 NOTE — Interval H&P Note (Signed)
History and Physical Interval Note:  02/21/2019 8:30 AM  Brittany Osborn  has presented today for surgery, with the diagnosis of distal esophageal stricture.  The various methods of treatment have been discussed with the patient and family.   Her son Barnabas Lister is here.  After consideration of risks, benefits and other options for treatment, the patient has consented to  Procedure(s): upper endoscopy with possible dilation (N/A) as a surgical intervention.  [name] history has been reviewed, patient examined, no change in status, stable for surgery.  I have reviewed the patient's chart and labs.  Questions were answered to the patient's satisfaction.     Shann Medal

## 2019-02-21 NOTE — Anesthesia Preprocedure Evaluation (Signed)
Anesthesia Evaluation  Patient identified by MRN, date of birth, ID band Patient awake    Reviewed: Allergy & Precautions, H&P , NPO status , Patient's Chart, lab work & pertinent test results  Airway Mallampati: II   Neck ROM: full    Dental   Pulmonary    breath sounds clear to auscultation       Cardiovascular hypertension,  Rhythm:regular Rate:Normal     Neuro/Psych  Headaches, PSYCHIATRIC DISORDERS Anxiety CVA    GI/Hepatic hiatal hernia, GERD  ,  Endo/Other    Renal/GU      Musculoskeletal  (+) Arthritis ,   Abdominal   Peds  Hematology   Anesthesia Other Findings   Reproductive/Obstetrics                             Anesthesia Physical Anesthesia Plan  ASA: III  Anesthesia Plan: MAC   Post-op Pain Management:    Induction: Intravenous  PONV Risk Score and Plan: 2 and Propofol infusion and Treatment may vary due to age or medical condition  Airway Management Planned: Nasal Cannula  Additional Equipment:   Intra-op Plan:   Post-operative Plan:   Informed Consent: I have reviewed the patients History and Physical, chart, labs and discussed the procedure including the risks, benefits and alternatives for the proposed anesthesia with the patient or authorized representative who has indicated his/her understanding and acceptance.       Plan Discussed with: CRNA, Anesthesiologist and Surgeon  Anesthesia Plan Comments:         Anesthesia Quick Evaluation

## 2019-02-21 NOTE — Op Note (Addendum)
02/21/2019  8:58 AM  PATIENT:  Brittany Osborn, 73 y.o., female, MRN: [id number]  PREOP DIAGNOSIS:  Possible esophageal stricture, hiatal hernia  POSTOP DIAGNOSIS:   Distal esophagitis, no esophageal stricture, 4 cm hiatal hernia, gastritis [photos in chart]  PROCEDURE:  Esophagogastroduedonoscopy  SURGEON:   Alphonsa Overall, M.D.  ANESTHESIA:   Anesthesiologist: Albertha Ghee, MD CRNA: Pilar Grammes, CRNA  INDICATIONS FOR PROCEDURE:  [name] is a 73 y.o. (DOB: 10-Apr-1946)  white female whose primary care physician is Arlyss Repress, MD and comes for upper endoscopy to evaluate a prior hiatal hernia repair and possible esophageal stricture   The indications and risks of the endoscopy were explained to the patient.  The risks include, but are not limited to, perforation, bleeding, or injury to the bowel.  PROCEDURE:  [name] anesthesia was supervised by an nurse anesthetist.   A flexible Olympus endoscope was passed down the throat without difficulty.  Findings include:   Esophagus:   Normal.  She has distal esophagitis which is benign appearing. This would be considered [place] Grade b - she had 3 areas and at least one was longer than 5 mm. Biopsies were obtained.   GE junction at:  32 cm   Stomach: The hiatus is about 36 cm, so this makes a 4 cm HH.  But the hiatus is widely open and I don't think is causing obstruction.  She has evidence of mild gastritis.  I did do a CLO test.  Normal pylorus.   Duodenum:   Normal to the second portion  PLAN:  Will treat with antiulcer meds.  Will discuss findings with the patient after pathology is returned.   Alphonsa Overall, MD, [facility] Pager: [phone] Office phone:  [phone]

## 2019-02-21 NOTE — Transfer of Care (Signed)
Immediate Anesthesia Transfer of Care Note  Patient: Brittany Osborn  Procedure(s) Performed: upper endoscopy with possible dilation (N/A ) BIOPSY  Patient Location: PACU  Anesthesia Type:MAC  Level of Consciousness: sedated and patient cooperative  Airway & Oxygen Therapy: Patient Spontanous Breathing and Patient connected to face mask oxygen  Post-op Assessment: Report given to RN and Post -op Vital signs reviewed and stable  Post vital signs: stable  Last Vitals:  Vitals Value Taken Time  BP 119/59 02/21/19 0901  Temp 36.4 C 02/21/19 0901  Pulse 90 02/21/19 0901  Resp 13 02/21/19 0911  SpO2 93 % 02/21/19 0901  Vitals shown include unvalidated device data.  Last Pain:  Vitals:   02/21/19 0901  TempSrc: Temporal  PainSc:          Complications: No apparent anesthesia complications

## 2019-02-21 NOTE — Discharge Instructions (Signed)
[facility] - DISCHARGE INSTRUCTIONS TO PATIENT  Activity:  Driving - may drive tomorrow, if doing well                       Practice your Covid-19 protection:  Wear a mask, social distance, and wash your hands frequently  Diet:  As tolerated  Follow up appointment:  Call Dr. Pollie Friar office [facility]) at [phone] for an appointment in 6 to 8 weeks.  Medications and dosages:  Resume your home medications.  You may restart your Plavix tomorrow.  You have a prescription for:  Protonix  Call Dr. Lucia Gaskins or his office  [phone]) if you have:  Temperature greater than 100.4,  Persistent nausea and vomiting,  Any other questions or concerns you may have after discharge.  In an emergency, call 911 or go to an Emergency Department at a nearby hospital.

## 2019-02-22 ENCOUNTER — Encounter (HOSPITAL_COMMUNITY): Payer: Self-pay | Admitting: Surgery

## 2019-02-22 LAB — SURGICAL PATHOLOGY

## 2019-02-22 NOTE — Anesthesia Postprocedure Evaluation (Signed)
Anesthesia Post Note  Patient: Brittany Osborn  Procedure(s) Performed: upper endoscopy with possible dilation (N/A ) BIOPSY     Patient location during evaluation: PACU Anesthesia Type: MAC Level of consciousness: awake and alert Pain management: pain level controlled Vital Signs Assessment: post-procedure vital signs reviewed and stable Respiratory status: spontaneous breathing, nonlabored ventilation, respiratory function stable and patient connected to nasal cannula oxygen Cardiovascular status: stable and blood pressure returned to baseline Postop Assessment: no apparent nausea or vomiting Anesthetic complications: no    Last Vitals:  Vitals:   02/21/19 0910 02/21/19 0920  BP: 129/67 126/61  Pulse:    Resp: (!) 32 13  Temp:    SpO2: 96% 96%    Last Pain:  Vitals:   02/22/19 1057  TempSrc:   PainSc: 0-No pain                 , S

## 2019-04-04 DIAGNOSIS — K219 Gastro-esophageal reflux disease without esophagitis: Secondary | ICD-10-CM | POA: Insufficient documentation

## 2019-07-10 ENCOUNTER — Telehealth: Payer: Self-pay | Admitting: Internal Medicine

## 2019-07-11 ENCOUNTER — Ambulatory Visit (INDEPENDENT_AMBULATORY_CARE_PROVIDER_SITE_OTHER): Payer: Medicare Other | Admitting: Family Medicine

## 2019-07-11 ENCOUNTER — Ambulatory Visit: Payer: Medicare Other | Admitting: Family Medicine

## 2019-07-11 ENCOUNTER — Other Ambulatory Visit: Payer: Self-pay

## 2019-07-11 ENCOUNTER — Encounter: Payer: Self-pay | Admitting: Family Medicine

## 2019-07-11 VITALS — BP 117/63 | HR 62 | Ht 62.0 in | Wt 157.0 lb

## 2019-07-11 DIAGNOSIS — E7800 Pure hypercholesterolemia, unspecified: Secondary | ICD-10-CM | POA: Diagnosis not present

## 2019-07-11 DIAGNOSIS — Z8673 Personal history of transient ischemic attack (TIA), and cerebral infarction without residual deficits: Secondary | ICD-10-CM

## 2019-07-11 DIAGNOSIS — R42 Dizziness and giddiness: Secondary | ICD-10-CM

## 2019-07-11 DIAGNOSIS — K219 Gastro-esophageal reflux disease without esophagitis: Secondary | ICD-10-CM | POA: Diagnosis not present

## 2019-07-11 DIAGNOSIS — I10 Essential (primary) hypertension: Secondary | ICD-10-CM | POA: Diagnosis not present

## 2019-07-11 MED ORDER — MECLIZINE HCL 25 MG PO TABS: 12.5000 mg | ORAL_TABLET | Freq: Three times a day (TID) | ORAL | 0 refills | Status: DC | PRN

## 2019-07-11 NOTE — Patient Instructions (Addendum)
How to Perform the Epley Maneuver The Epley maneuver is an exercise that relieves symptoms of vertigo. Vertigo is the feeling that you or your surroundings are moving when they are not. When you feel vertigo, you may feel like the room is spinning and have trouble walking. Dizziness is a little different than vertigo. When you are dizzy, you may feel unsteady or light-headed. You can do this maneuver at home whenever you have symptoms of vertigo. You can do it up to 3 times a day until your symptoms go away. Even though the Epley maneuver may relieve your vertigo for a few weeks, it is possible that your symptoms will return. This maneuver relieves vertigo, but it does not relieve dizziness. What are the risks? If it is done correctly, the Epley maneuver is considered safe. Sometimes it can lead to dizziness or nausea that goes away after a short time. If you develop other symptoms, such as changes in vision, weakness, or numbness, stop doing the maneuver and call your health care provider. How to perform the Epley maneuver 1. Sit on the edge of a bed or table with your back straight and your legs extended or hanging over the edge of the bed or table. 2. Turn your head halfway toward the affected ear or side. 3. Lie backward quickly with your head turned until you are lying flat on your back. You may want to position a pillow under your shoulders. 4. Hold this position for 30 seconds. You may experience an attack of vertigo. This is normal. 5. Turn your head to the opposite direction until your unaffected ear is facing the floor. 6. Hold this position for 30 seconds. You may experience an attack of vertigo. This is normal. Hold this position until the vertigo stops. 7. Turn your whole body to the same side as your head. Hold for another 30 seconds. 8. Sit back up. You can repeat this exercise up to 3 times a day. Follow these instructions at home:  After doing the Epley maneuver, you can return to  your normal activities.  Ask your health care provider if there is anything you should do at home to prevent vertigo. He or she may recommend that you: ? Keep your head raised (elevated) with two or more pillows while you sleep. ? Do not sleep on the side of your affected ear. ? Get up slowly from bed. ? Avoid sudden movements during the day. ? Avoid extreme head movement, like looking up or bending over. Contact a health care provider if:  Your vertigo gets worse.  You have other symptoms, including: ? Nausea. ? Vomiting. ? Headache. Get help right away if:  You have vision changes.  You have a severe or worsening headache or neck pain.  You cannot stop vomiting.  You have new numbness or weakness in any part of your body. Summary  Vertigo is the feeling that you or your surroundings are moving when they are not.  The Epley maneuver is an exercise that relieves symptoms of vertigo.  If the Epley maneuver is done correctly, it is considered safe. You can do it up to 3 times a day. This information is not intended to replace advice given to you by your health care provider. Make sure you discuss any questions you have with your health care provider. Document Revised: 04/28/2017 Document Reviewed: 04/05/2016 Elsevier Patient Education  [place].   Vertigo Vertigo is the feeling that you or the things around you are  moving when they are not. This feeling can come and go at any time. Vertigo often goes away on its own. This condition can be dangerous if it happens when you are doing activities like driving or working with machines. Your doctor will do tests to find the cause of your vertigo. These tests will also help your doctor decide on the best treatment for you. Follow these instructions at home: Eating and drinking      Drink enough fluid to keep your pee (urine) pale yellow.  Do not drink alcohol. Activity  Return to your normal activities as told by  your doctor. Ask your doctor what activities are safe for you.  In the morning, first sit up on the side of the bed. When you feel okay, stand slowly while you hold onto something until you know that your balance is fine.  Move slowly. Avoid sudden body or head movements or certain positions, as told by your doctor.  Use a cane if you have trouble standing or walking.  Sit down right away if you feel dizzy.  Avoid doing any tasks or activities that can cause danger to you or others if you get dizzy.  Avoid bending down if you feel dizzy. Place items in your home so that they are easy for you to reach without leaning over.  Do not drive or use heavy machinery if you feel dizzy. General instructions  Take over-the-counter and prescription medicines only as told by your doctor.  Keep all follow-up visits as told by your doctor. This is important. Contact a doctor if:  Your medicine does not help your vertigo.  You have a fever.  Your problems get worse or you have new symptoms.  Your family or friends see changes in your behavior.  The feeling of being sick to your stomach gets worse.  Your vomiting gets worse.  You lose feeling (have numbness) in part of your body.  You feel prickling and tingling in a part of your body. Get help right away if:  You have trouble moving or talking.  You are always dizzy.  You pass out (faint).  You get very bad headaches.  You feel weak in your hands, arms, or legs.  You have changes in your hearing.  You have changes in how you see (vision).  You get a stiff neck.  Bright light starts to bother you. Summary  Vertigo is the feeling that you or the things around you are moving when they are not.  Your doctor will do tests to find the cause of your vertigo.  You may be told to avoid some tasks, positions, or movements.  Contact a doctor if your medicine is not helping, or if you have a fever, new symptoms, or a change in  behavior.  Get help right away if you get very bad headaches, or if you have changes in how you speak, hear, or see. This information is not intended to replace advice given to you by your health care provider. Make sure you discuss any questions you have with your health care provider. Document Revised: 04/09/2018 Document Reviewed: 04/09/2018 Elsevier Patient Education  2020 [place].   Peptic Ulcer Eating Plan Peptic ulcers are sores that form on the lining of the stomach, esophagus, or the part of the small intestine that is attached to the stomach (duodenum). These sores are also called stomach ulcers. When ulcers develop, they can cause a burning feeling in the stomach as well as bloating,  nausea, vomiting, and poor appetite. If you have a history of peptic ulcers, it is important to keep track of what foods and drinks cause symptoms. What are tips for following this plan?   Eat a healthy, well-balanced diet. This includes: ? Fresh fruits and vegetables. Eat a variety of colors of fruits and vegetables. ? Whole grains. Try to make sure at least half of the grains you eat each day are whole grains. ? Low-fat dairy. ? Lean meat, fish, poultry, eggs, beans, and nuts. ? Healthy fats, such as olive oil, grapeseed oil, or canola oil. Try to eat less than 8 teaspoons of fats and oils each day.  Avoid foods that cause irritation or pain. These may be different for different people. Keep a food diary to identify foods that cause symptoms.  Avoid processed foods that have added salt and sugar.  Avoid drinking alcohol.  Avoid drinks with caffeine, such as cola, black tea, energy drinks, and coffee. Recommended foods Grains  Whole grains. Vegetables  All fresh or frozen vegetables. Low-sodium canned vegetables. Fruits  All fresh, frozen, or dried fruit. Fruit canned in juice. Meats and other protein foods  Lean cuts of meat. Skinless poultry. Fresh or canned fish. Eggs. Tofu.  Nuts and nut butter. Dried beans. Low-sodium canned beans. Dairy  Low-fat or nonfat (skim) milk. Nonfat or low-fat yogurt. Nonfat or low-fat cheese. Beverages  Water. Soy or nut milks. Caffeine-free soft drinks. Herbal tea. Fats and oils  Olive oil. Canola oil. Grapeseed oil. Sunflower oil. Seasoning and other foods  Low-fat salad dressing. Ketchup. Low-fat mayonnaise. All spices except pepper. Low-sodium seasoning mixes. Foods to avoid Meats and other protein foods  Fatty meats. Fried meats. Any meat that causes symptoms. Dairy  Whole milk. Ice cream. Cream. Chocolate milk. Beverages  Alcohol. Coffee. Cola and energy drinks. Black or green tea. Cocoa. Fats and oils  Butter. Lard. Ghee. Seasoning and other foods  Pepper. Hot sauce. Any seasonings or condiments that cause symptoms. Summary  Peptic ulcers can cause burning in the stomach as well as bloating, nausea, vomiting, and poor appetite. You may be able to limit symptoms by avoiding foods that make you feel worse.  Work with your dietitian or health care provider to identify foods that cause symptoms. This may include caffeinated drinks, alcohol, or pepper. This information is not intended to replace advice given to you by your health care provider. Make sure you discuss any questions you have with your health care provider. Document Revised: 04/28/2017 Document Reviewed: 06/27/2016 Elsevier Patient Education  2020 [employer].

## 2019-07-11 NOTE — Progress Notes (Signed)
New Patient Office Visit  Assessment & Plan:  1. Dizziness - Education provided on doing the Epley maneuver at home as well as vertigo.  Discussed trial of meclizine.  At follow-up if patient's dizziness has not improved we will refer to neurology.  Patient was encouraged to resume the Plavix since it was clearly not the cause of her dizziness if the dizziness returned when she was still not taking it. - meclizine (ANTIVERT) 25 MG tablet; Take 0.5-1 tablets (12.5-25 mg total) by mouth 3 (three) times daily as needed for dizziness.  Dispense: 30 tablet; Refill: 0  2. Essential hypertension - Well controlled on current regimen.   3. Gastroesophageal reflux disease without esophagitis - Well controlled on current regimen.   4. Pure hypercholesterolemia - Well controlled on current regimen.   5. History of stroke - Well controlled on current regimen.    Follow-up: Return in about 2 weeks (around 07/25/2019) for dizziness.   Hendricks Limes, MSN, APRN, FNP-C [facility]  Subjective:  Patient ID: Brittany Osborn, Brittany Osborn    DOB: 19-May-1946  Age: 74 y.o. MRN: [id number]  Patient Care Team: Loman Brooklyn, FNP as PCP - General (Family Medicine) Carol Ada, MD as Consulting Physician (Gastroenterology)  CC:  Chief Complaint  Patient presents with  . New Patient (Initial Visit)    HPI [facility] presents to establish care. Patient is transferring care due to the drive to her previous PCP.   Patient is concerned regarding dizziness that occurs mostly when she is lying in the bed or upon standing.  This has been going on for 2 weeks now.  It does not matter if she is laying on any particular side in the bed or flat on her back.  She stopped taking her Plavix several days ago thinking it may be the cause of the dizziness.  She reports that the dizziness did resolve for 2 days but then returned.  Patient is taking Plavix, atorvastatin, and aspirin due to history  of stroke and TIA.  She reports she only saw the neurologist once and did not go back as the neurologist told her that she did not really have a stroke.  She reports she knows very well she did have a stroke because she had to go to therapy to learn how to walk again.  She is questioning whether she needs to stay on the Plavix due to her skin easily bruising.  Patient takes melatonin as needed "to keep her calm".  She has had a hard time since the death of her husband in 2018/10/31.  To make matters worse they messed up his death certificate and wrote the wrong county, so to governmental agencies and similar her husband is still alive.  Depression screen [facility] 2/9 07/11/2019 10/24/2018 07/12/2018  Decreased Interest 0 0 0  Down, Depressed, Hopeless 1 3 1   PHQ - 2 Score 1 3 1   Altered sleeping 0 0 -  Tired, decreased energy 1 3 -  Change in appetite 1 3 -  Feeling bad or failure about yourself  1 0 -  Trouble concentrating 0 2 -  Moving slowly or fidgety/restless 0 3 -  Suicidal thoughts 0 0 -  PHQ-9 Score 4 14 -  Difficult doing work/chores - Somewhat difficult -    Review of Systems  Constitutional: Negative for chills, fever, malaise/fatigue and weight loss.  HENT: Negative for congestion, ear discharge, ear pain, nosebleeds, sinus pain, sore throat and tinnitus.  Eyes: Negative for blurred vision, double vision, pain, discharge and redness.  Respiratory: Negative for cough, shortness of breath and wheezing.   Cardiovascular: Negative for chest pain, palpitations and leg swelling.  Gastrointestinal: Negative for abdominal pain, constipation, diarrhea, heartburn, nausea and vomiting.  Genitourinary: Negative for dysuria, frequency and urgency.  Musculoskeletal: Negative for myalgias.  Skin: Negative for rash.  Neurological: Positive for dizziness. Negative for seizures, weakness and headaches.  Endo/Heme/Allergies: Bruises/bleeds easily.  Psychiatric/Behavioral: Negative for depression,  substance abuse and suicidal ideas. The patient is not nervous/anxious.     Current Outpatient Medications:  .  aspirin EC 81 MG tablet, Take 81 mg by mouth daily., Disp: , Rfl:  .  atorvastatin (LIPITOR) 20 MG tablet, Take 1 tablet (20 mg total) by mouth daily at 6 PM., Disp: 30 tablet, Rfl: 6 .  docusate sodium (COLACE) 100 MG capsule, Take 100 mg by mouth daily as needed for mild constipation., Disp: , Rfl:  .  lisinopril (ZESTRIL) 10 MG tablet, Take 10 mg by mouth daily., Disp: , Rfl:  .  Melatonin 3 MG CAPS, Take 3 mg by mouth 2 (two) times daily as needed (anxiety/jitters)., Disp: , Rfl:  .  acetaminophen (TYLENOL) 500 MG tablet, Take 1,000 mg by mouth daily as needed for moderate pain or headache., Disp: , Rfl:  .  clopidogrel (PLAVIX) 75 MG tablet, Take 1 tablet (75 mg total) by mouth daily. (Patient not taking: Reported on 07/11/2019), Disp: 30 tablet, Rfl: 11 .  meclizine (ANTIVERT) 25 MG tablet, Take 0.5-1 tablets (12.5-25 mg total) by mouth 3 (three) times daily as needed for dizziness., Disp: 30 tablet, Rfl: 0  Allergies  Allergen Reactions  . Strawberry Extract Anaphylaxis  . Ciprofloxacin Swelling  . Pneumococcal Vaccine Swelling       . Sulfa Drugs Cross Reactors Nausea And Vomiting  . Citalopram     Agitation/anger   . Codeine Other (See Comments)    Headache   . Other     general anesthesia - difficulty waking up    Past Medical History:  Diagnosis Date  . Anxiety   . Arthritis   . Cataracts, bilateral    Corrected with surgery  . Complication of anesthesia    slow to awaken 6 yrs ago after ruptured hiatal hernia surgery  . Family history of adverse reaction to anesthesia    women in family hard to awaken  . GERD (gastroesophageal reflux disease)   . H/O hiatal hernia   . Headache(784.0)   . Hyperlipidemia   . Hypertension   . 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 BIOPSY  02/21/2019   Procedure: BIOPSY;  Surgeon: Alphonsa Overall, MD;  Location: [facility];  Service: General;;  . CESAREAN SECTION    . COLONOSCOPY  09/16/2011   Procedure: COLONOSCOPY;  Surgeon: Beryle Beams, MD;  Location: [facility];  Service: Endoscopy;  Laterality: N/A;  . ESOPHAGOGASTRODUODENOSCOPY  09/16/2011   Procedure: ESOPHAGOGASTRODUODENOSCOPY (EGD);  Surgeon: Beryle Beams, MD;  Location: [facility];  Service: Endoscopy;  Laterality: N/A;  Per Dr. Benson Norway, pt wants do procedures at [facility] instead of [facility]  . ESOPHAGOGASTRODUODENOSCOPY N/A 02/21/2019   Procedure: upper endoscopy with possible dilation;  Surgeon: Alphonsa Overall, MD;  Location: [facility];  Service: General;  Laterality: N/A;  . EYE SURGERY     cataracts both  eyes  . LAPAROSCOPIC NISSEN FUNDOPLICATION N/A [id number]   Procedure: LAPAROSCOPIC NISSEN FUNDOPLICATION laparoscopic hiatal hernia repair  ;  Surgeon: Shann Medal, MD;  Location: [facility];  Service: General;  Laterality: N/A;  . TONSILLECTOMY  age 5    Family History  Problem Relation Age of Onset  . Heart disease Mother   . Lung cancer Father   . Alcohol abuse Brother     Social History   Socioeconomic History  . Marital status: Widowed    Spouse name: Not on file  . Number of children: Not on file  . Years of education: Not on file  . Highest education level: Not on file  Occupational History  . Not on file  Tobacco Use  . Smoking status: Never Smoker  . Smokeless tobacco: Never Used  Substance and Sexual Activity  . Alcohol use: No  . Drug use: No  . Sexual activity: Not Currently  Other Topics Concern  . Not on file  Social History Narrative   [place]  Lives with  son. She worked as an Optometrist and a Oceanographer, retired 10 years ago.    Social Determinants of Health   Financial Resource Strain:   . Difficulty of Paying Living Expenses: Not on file  Food Insecurity:   . Worried About  Charity fundraiser in the Last Year: Not on file  . Ran Out of Food in the Last Year: Not on file  Transportation Needs:   . Lack of Transportation (Medical): Not on file  . Lack of Transportation (Non-Medical): Not on file  Physical Activity:   . Days of Exercise per Week: Not on file  . Minutes of Exercise per Session: Not on file  Stress:   . Feeling of Stress : Not on file  Social Connections:   . Frequency of Communication with Friends and Family: Not on file  . Frequency of Social Gatherings with Friends and Family: Not on file  . Attends Religious Services: Not on file  . Active Member of Clubs or Organizations: Not on file  . Attends Archivist Meetings: Not on file  . Marital Status: Not on file  Intimate Partner Violence:   . Fear of Current or Ex-Partner: Not on file  . Emotionally Abused: Not on file  . Physically Abused: Not on file  . Sexually Abused: Not on file    Objective:   Today's Vitals: BP 117/63   Pulse 62   Ht 5\' 2"  (1.575 m)   Wt 157 lb (71.2 kg)   BMI 28.72 kg/m   Physical Exam Vitals reviewed.  Constitutional:      General: She is not in acute distress.    Appearance: Normal appearance. She is overweight. She is not ill-appearing, toxic-appearing or diaphoretic.  HENT:     Head: Normocephalic and atraumatic.     Right Ear: Tympanic membrane, ear canal and external ear normal. There is no impacted cerumen.     Left Ear: Tympanic membrane, ear canal and external ear normal. There is no impacted cerumen.  Eyes:     General: No scleral icterus.       Right eye: No discharge.        Left eye: No discharge.     Conjunctiva/sclera: Conjunctivae normal.  Cardiovascular:     Rate and Rhythm: Normal rate and regular rhythm.     Heart sounds: Normal heart sounds. No murmur. No friction rub. No gallop.   Pulmonary:  Effort: Pulmonary effort is normal. No respiratory distress.     Breath sounds: Normal breath sounds. No stridor. No  wheezing, rhonchi or rales.  Musculoskeletal:        General: Normal range of motion.     Cervical back: Normal range of motion.  Skin:    General: Skin is warm and dry.     Capillary Refill: Capillary refill takes less than 2 seconds.  Neurological:     General: No focal deficit present.     Mental Status: She is alert and oriented to person, place, and time. Mental status is at baseline.  Psychiatric:        Mood and Affect: Mood normal.        Behavior: Behavior normal.        Thought Content: Thought content normal.        Judgment: Judgment normal.

## 2019-07-15 NOTE — Progress Notes (Signed)
Checked with [facility] and patient does not have a chart in there.

## 2019-07-22 ENCOUNTER — Ambulatory Visit: Payer: Medicare Other

## 2019-07-25 ENCOUNTER — Other Ambulatory Visit: Payer: Self-pay

## 2019-07-26 ENCOUNTER — Ambulatory Visit (INDEPENDENT_AMBULATORY_CARE_PROVIDER_SITE_OTHER): Payer: Medicare Other | Admitting: Family Medicine

## 2019-07-26 ENCOUNTER — Encounter: Payer: Self-pay | Admitting: Family Medicine

## 2019-07-26 DIAGNOSIS — R42 Dizziness and giddiness: Secondary | ICD-10-CM

## 2019-07-26 MED ORDER — LISINOPRIL 10 MG PO TABS: 10.0000 mg | ORAL_TABLET | Freq: Every day | ORAL | 1 refills | Status: DC

## 2019-07-26 MED ORDER — MECLIZINE HCL 12.5 MG PO TABS: 12.5000 mg | ORAL_TABLET | Freq: Three times a day (TID) | ORAL | 2 refills | Status: DC | PRN

## 2019-07-26 MED ORDER — CLOPIDOGREL BISULFATE 75 MG PO TABS: 75.0000 mg | ORAL_TABLET | Freq: Every day | ORAL | 1 refills | Status: DC

## 2019-07-26 MED ORDER — ATORVASTATIN CALCIUM 20 MG PO TABS: 20.0000 mg | ORAL_TABLET | Freq: Every day | ORAL | 1 refills | Status: DC

## 2019-07-26 MED ORDER — VITAMIN D (ERGOCALCIFEROL) 1.25 MG (50000 UNIT) PO CAPS: 50000.0000 [IU] | ORAL_CAPSULE | ORAL | 1 refills | Status: DC

## 2019-07-26 NOTE — Patient Instructions (Signed)
Look at your vaccine records and let me know your last tetanus vaccine date.

## 2019-07-26 NOTE — Progress Notes (Signed)
Assessment & Plan:  1. Dizziness - Improved and controlled with current regimen. Refill sent for meclizine 12.5 mg tablets so that she doesn't have to cut them in half.  - meclizine (ANTIVERT) 12.5 MG tablet; Take 1 tablet (12.5 mg total) by mouth 3 (three) times daily as needed for dizziness.  Dispense: 30 tablet; Refill: 2   Return in about 3 months (around 10/23/2019) for follow-up of chronic medication conditions.  Hendricks Limes, MSN, APRN, FNP-C [facility]  Subjective:    Patient ID: Brittany Osborn, female    DOB: 1945-08-22, 74 y.o.   MRN: [id number]  Patient Care Team: Loman Brooklyn, FNP as PCP - General (Family Medicine) Carol Ada, MD as Consulting Physician (Gastroenterology)   Chief Complaint:  Chief Complaint  Patient presents with  . Dizziness    pt here today for 2 week recheck on dizziness    HPI: Brittany Osborn is a 74 y.o. female presenting on 07/26/2019 for Dizziness (pt here today for 2 week recheck on dizziness)  Patient was seen on 07/11/2019 with c/o dizziness. Education was provided on Epley maneuver. She was given a prescription of Meclizine and advised to resume Plavix which she has stopped to see if it was a cause of the dizziness. Today patient reports she did not try the Epley maneuver, but that the dizziness is much better. It does come occasionally but it not near as severe. She is only taking half tablet of the Meclizine when she feels it coming on. It is effective in stopping it.    New complaints: None  Social history:  Relevant past medical, surgical, family and social history reviewed and updated as indicated. Interim medical history since our last visit reviewed.  Allergies and medications reviewed and updated.  DATA REVIEWED: CHART IN EPIC  ROS: Negative unless specifically indicated above in HPI.    Current Outpatient Medications:  .  acetaminophen (TYLENOL) 500 MG tablet, Take 1,000 mg by mouth daily as  needed for moderate pain or headache., Disp: , Rfl:  .  aspirin EC 81 MG tablet, Take 81 mg by mouth daily., Disp: , Rfl:  .  atorvastatin (LIPITOR) 20 MG tablet, Take 1 tablet (20 mg total) by mouth daily at 6 PM., Disp: 90 tablet, Rfl: 1 .  clopidogrel (PLAVIX) 75 MG tablet, Take 1 tablet (75 mg total) by mouth daily., Disp: 90 tablet, Rfl: 1 .  docusate sodium (COLACE) 100 MG capsule, Take 100 mg by mouth daily as needed for mild constipation., Disp: , Rfl:  .  lisinopril (ZESTRIL) 10 MG tablet, Take 1 tablet (10 mg total) by mouth daily., Disp: 90 tablet, Rfl: 1 .  meclizine (ANTIVERT) 12.5 MG tablet, Take 1 tablet (12.5 mg total) by mouth 3 (three) times daily as needed for dizziness., Disp: 30 tablet, Rfl: 2 .  Melatonin 3 MG CAPS, Take 3 mg by mouth 2 (two) times daily as needed (anxiety/jitters)., Disp: , Rfl:  .  Vitamin D, Ergocalciferol, (DRISDOL) 1.25 MG (50000 UNIT) CAPS capsule, Take 1 capsule (50,000 Units total) by mouth every 7 (seven) days., Disp: 12 capsule, Rfl: 1   Allergies  Allergen Reactions  . Strawberry Extract Anaphylaxis  . Ciprofloxacin Swelling  . Pneumococcal Vaccine Swelling       . Sulfa Drugs Cross Reactors Nausea And Vomiting  . Citalopram     Agitation/anger   . Codeine Other (See Comments)    Headache   . Other  general anesthesia - difficulty waking up   Past Medical History:  Diagnosis Date  . Anxiety   . Arthritis   . Cataracts, bilateral    Corrected with surgery  . Complication of anesthesia    slow to awaken 6 yrs ago after ruptured hiatal hernia surgery  . Family history of adverse reaction to anesthesia    women in family hard to awaken  . GERD (gastroesophageal reflux disease)   . H/O hiatal hernia   . Headache(784.0)   . Hyperlipidemia   . Hypertension   . Stroke [facility]) 09/2018 and old cva not sure when   09-2018 took tpa for, no residual defecits on plavix    Past Surgical History:  Procedure Laterality Date  . BIOPSY   02/21/2019   Procedure: BIOPSY;  Surgeon: Alphonsa Overall, MD;  Location: [facility];  Service: General;;  . CESAREAN SECTION    . COLONOSCOPY  09/16/2011   Procedure: COLONOSCOPY;  Surgeon: Beryle Beams, MD;  Location: [place];  Service: Endoscopy;  Laterality: N/A;  . ESOPHAGOGASTRODUODENOSCOPY  09/16/2011   Procedure: ESOPHAGOGASTRODUODENOSCOPY (EGD);  Surgeon: Beryle Beams, MD;  Location: [facility] END[facility]COPY;  Service: Endoscopy;  Laterality: N/A;  Per Dr. Benson Norway, pt wants do procedures at [facility] of [facility]  . ESOPHAGOGASTRODUODENOSCOPY N/A 02/21/2019   Procedure: upper endoscopy with possible dilation;  Surgeon: Alphonsa Overall, MD;  Location: [facility];  Service: General;  Laterality: N/A;  . EYE SURGERY     cataracts both eyes  . LAPAROSCOPIC NISSEN FUNDOPLICATION N/A [id number]   Procedure: LAPAROSCOPIC NISSEN FUNDOPLICATION laparoscopic hiatal hernia repair  ;  Surgeon: Shann Medal, MD;  Location: [facility];  Service: General;  Laterality: N/A;  . TONSILLECTOMY  age 51    Social History   Socioeconomic History  . Marital status: Widowed    Spouse name: Not on file  . Number of children: Not on file  . Years of education: Not on file  . Highest education level: Not on file  Occupational History  . Not on file  Tobacco Use  . Smoking status: Never Smoker  . Smokeless tobacco: Never Used  Substance and Sexual Activity  . Alcohol use: No  . Drug use: No  . Sexual activity: Not Currently  Other Topics Concern  . Not on file  Social History Narrative   [place]  Lives with  son. She worked as an Optometrist and a Oceanographer, retired 10 years ago.    Social Determinants of Health   Financial Resource Strain:   . Difficulty of Paying Living Expenses: Not on file  Food Insecurity:   . Worried About Charity fundraiser in the Last Year: Not on file  . Ran Out of Food in the Last Year: Not on file  Transportation Needs:   . Lack of Transportation (Medical): Not on file    . Lack of Transportation (Non-Medical): Not on file  Physical Activity:   . Days of Exercise per Week: Not on file  . Minutes of Exercise per Session: Not on file  Stress:   . Feeling of Stress : Not on file  Social Connections:   . Frequency of Communication with Friends and Family: Not on file  . Frequency of Social Gatherings with Friends and Family: Not on file  . Attends Religious Services: Not on file  . Active Member of Clubs or Organizations: Not on file  . Attends Archivist Meetings: Not on file  . Marital Status: Not  on file  Intimate Partner Violence:   . Fear of Current or Ex-Partner: Not on file  . Emotionally Abused: Not on file  . Physically Abused: Not on file  . Sexually Abused: Not on file        Objective:    BP 119/69   Pulse 65   Temp 98.4 F (36.9 C) (Oral)   Ht 5\' 2"  (1.575 m)   Wt 157 lb 2 oz (71.3 kg)   BMI 28.74 kg/m   Physical Exam Vitals reviewed.  Constitutional:      General: She is not in acute distress.    Appearance: Normal appearance. She is not ill-appearing, toxic-appearing or diaphoretic.  HENT:     Head: Normocephalic and atraumatic.  Eyes:     General: No scleral icterus.       Right eye: No discharge.        Left eye: No discharge.     Conjunctiva/sclera: Conjunctivae normal.  Cardiovascular:     Rate and Rhythm: Normal rate and regular rhythm.     Heart sounds: Normal heart sounds. No murmur. No friction rub. No gallop.   Pulmonary:     Effort: Pulmonary effort is normal. No respiratory distress.     Breath sounds: Normal breath sounds. No stridor. No wheezing, rhonchi or rales.  Musculoskeletal:        General: Normal range of motion.     Cervical back: Normal range of motion.  Skin:    General: Skin is warm and dry.     Capillary Refill: Capillary refill takes less than 2 seconds.  Neurological:     General: No focal deficit present.     Mental Status: She is alert and oriented to person, place, and  time. Mental status is at baseline.  Psychiatric:        Mood and Affect: Mood normal.        Behavior: Behavior normal.        Thought Content: Thought content normal.        Judgment: Judgment normal.     No results found for: TSH Lab Results  Component Value Date   WBC 4.8 10/21/2018   HGB 13.3 10/21/2018   HCT 40.0 10/21/2018   MCV 96.6 10/21/2018   PLT 197 10/21/2018   Lab Results  Component Value Date   NA 136 10/21/2018   K 4.1 10/21/2018   CO2 26 10/21/2018   GLUCOSE 99 10/21/2018   BUN 12 10/21/2018   CREATININE 0.77 10/21/2018   BILITOT 0.8 10/16/2018   ALKPHOS 53 10/16/2018   AST 21 10/16/2018   ALT 18 10/16/2018   PROT 6.8 10/16/2018   ALBUMIN 4.2 10/16/2018   CALCIUM 8.7 (L) 10/21/2018   ANIONGAP 6 10/21/2018   Lab Results  Component Value Date   CHOL 162 10/17/2018   Lab Results  Component Value Date   HDL 49 10/17/2018   Lab Results  Component Value Date   LDLCALC 101 (H) 10/17/2018   Lab Results  Component Value Date   TRIG 59 10/17/2018   Lab Results  Component Value Date   CHOLHDL 3.3 10/17/2018   Lab Results  Component Value Date   HGBA1C 5.2 10/17/2018

## 2019-07-29 ENCOUNTER — Encounter: Payer: Self-pay | Admitting: Family Medicine

## 2019-09-04 IMAGING — RF UPPER GI SERIES W/HIGH DENSITY WITHOUT KUB
10 of 11 series · 14 of 24 positions shown · non-contrast
Comparison: 11/13/2012

CLINICAL DATA: Esophageal dysphagia.

EXAM:
UPPER GI SERIES WITH KUB
TECHNIQUE: After obtaining a scout radiograph a routine upper GI series was
performed using thin and high density barium.
FLUOROSCOPY TIME:  Fluoroscopy Time:  3 minutes and 6 seconds.
Radiation Exposure Index (if provided by the fluoroscopic device):
236 mGy
Number of Acquired Spot Images:

[Series 1: one shot · 0.14mm/px · 1 of 1 slices shown (1 of 3)]
[im 1/1]
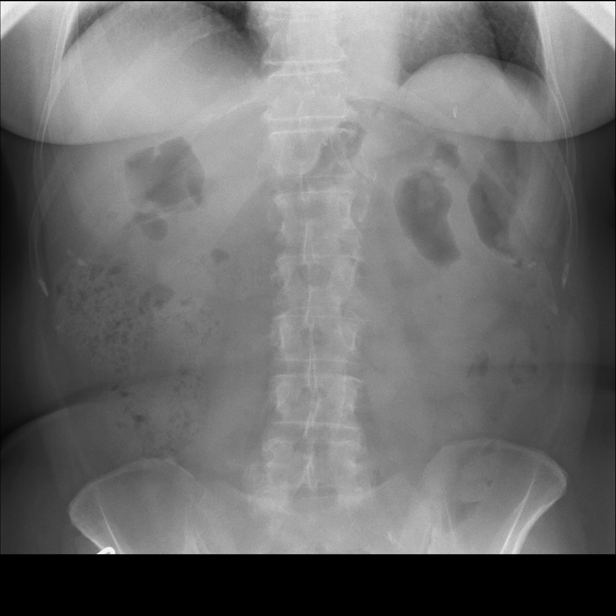

[Series 2: sequence · 0.29mm/px · 1 of 12 frames shown (1 of 7)]
[frame 7/12]
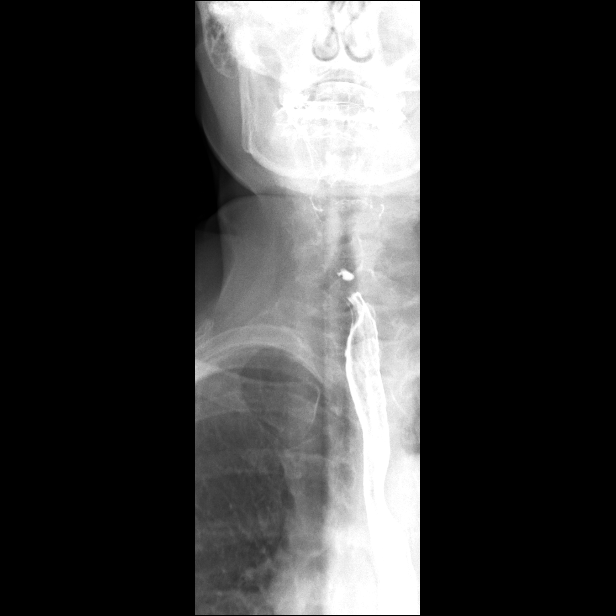

[Series 3: sequence · 0.29mm/px · 1 of 12 frames shown (2 of 7)]
[frame 11/12]
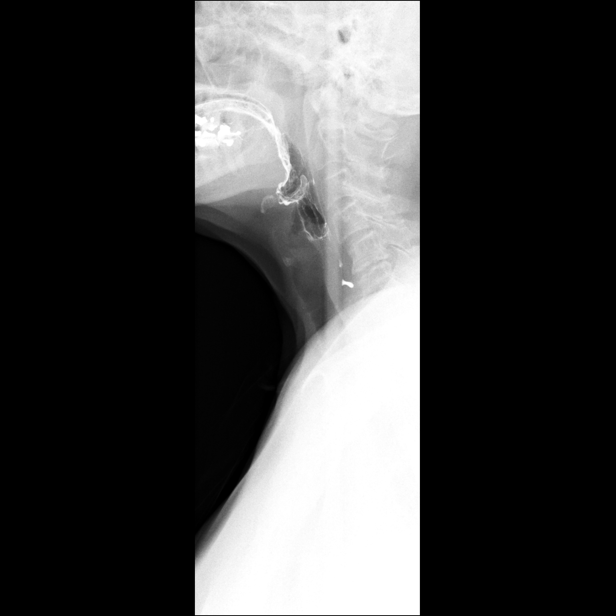

[Series 4: sequence · 0.29mm/px · 1 of 27 frames shown (3 of 7)]
[frame 14/27]
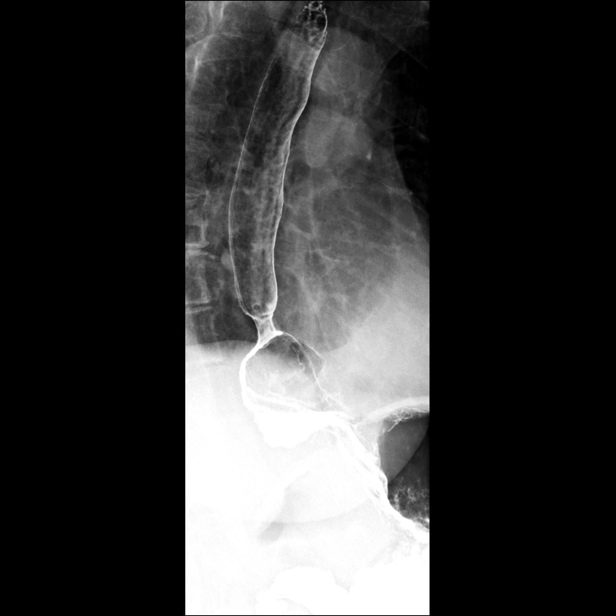

[Series 5: sequence · 1 of 84 frames shown (4 of 7)]
[frame 13/84]
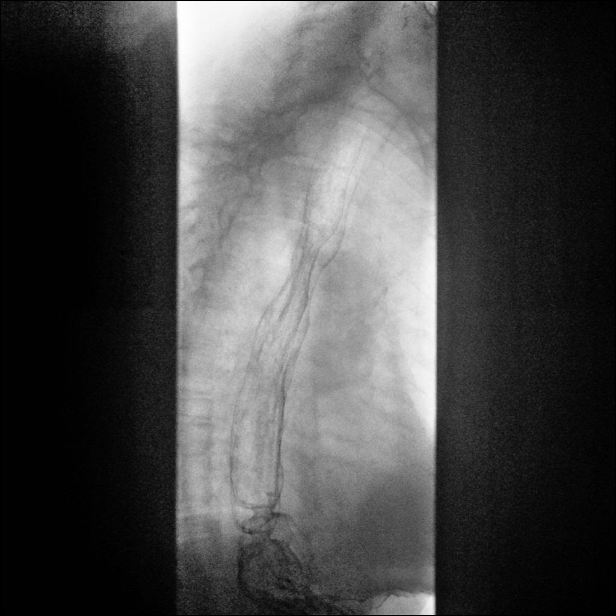

[Series 6: sequence · 0.29mm/px · 1 of 1 slices shown (5 of 7)]
[im 1/1]
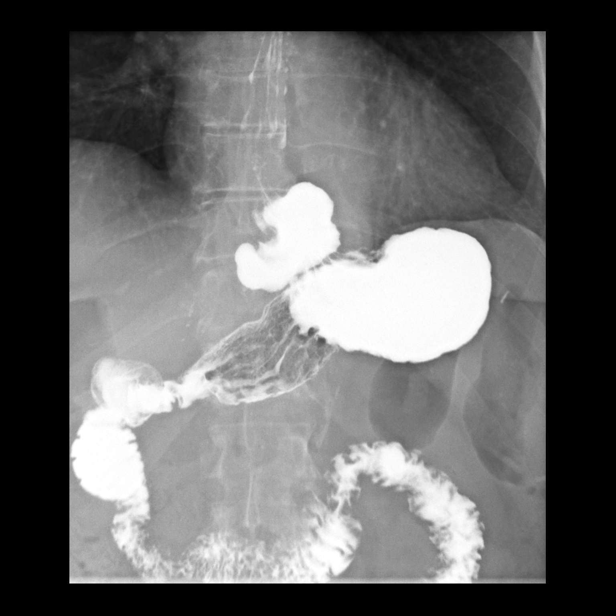

[Series 7: one shot · 0.15mm/px · 4 of 14 slices shown (2 of 3)]
[im 3/14]
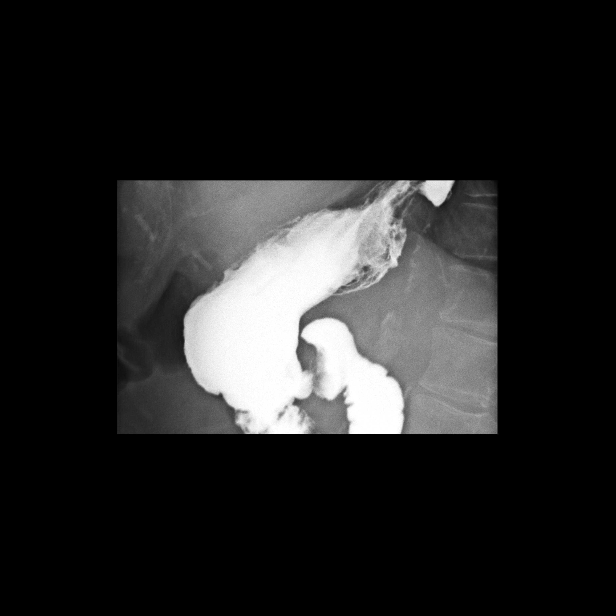
[im 5/14]
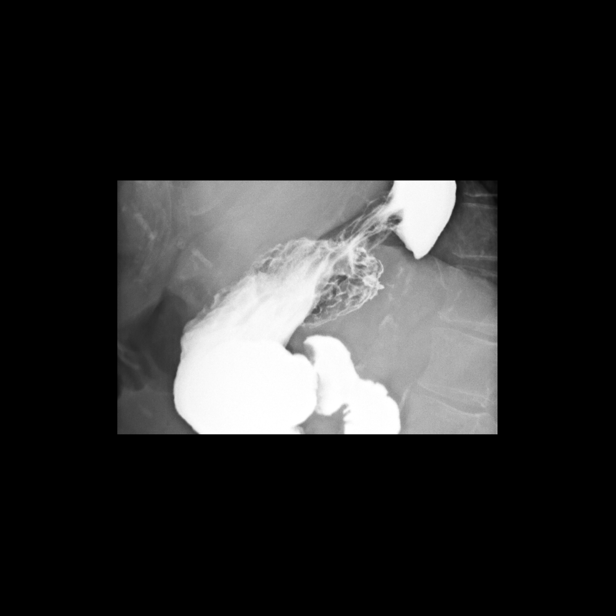
[im 8/14]
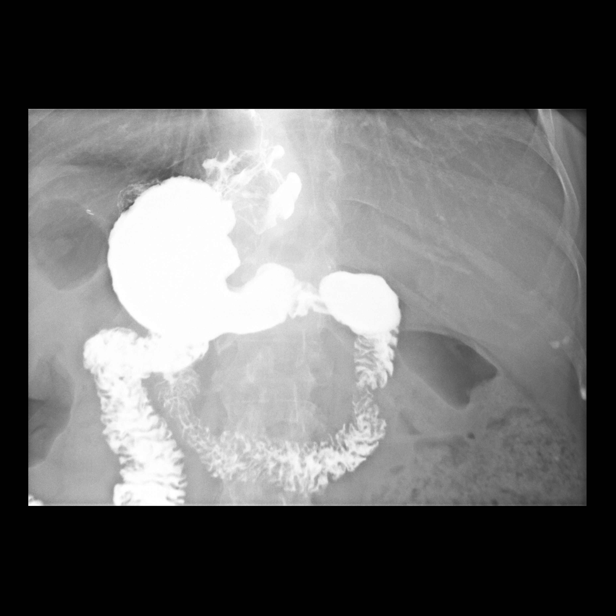
[im 12/14]
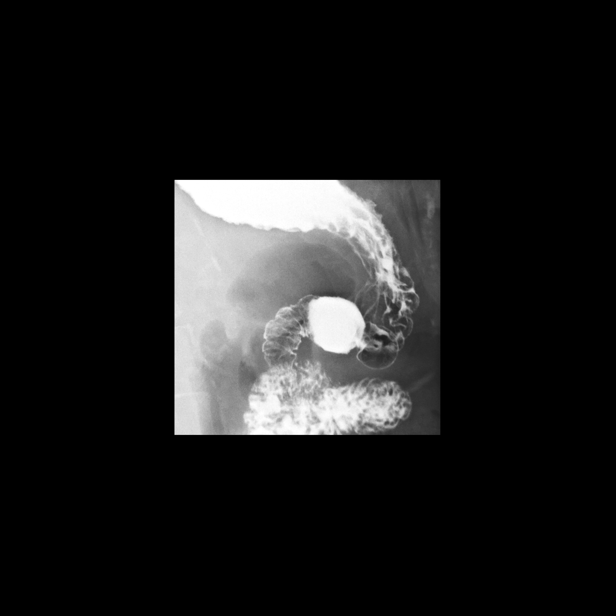

[Series 8: sequence · 2 of 88 frames shown (6 of 7)]
[frame 14/88]
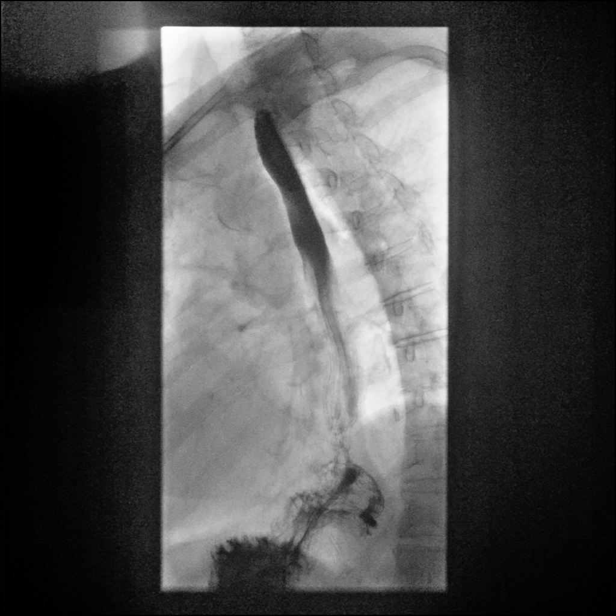
[frame 54/88]
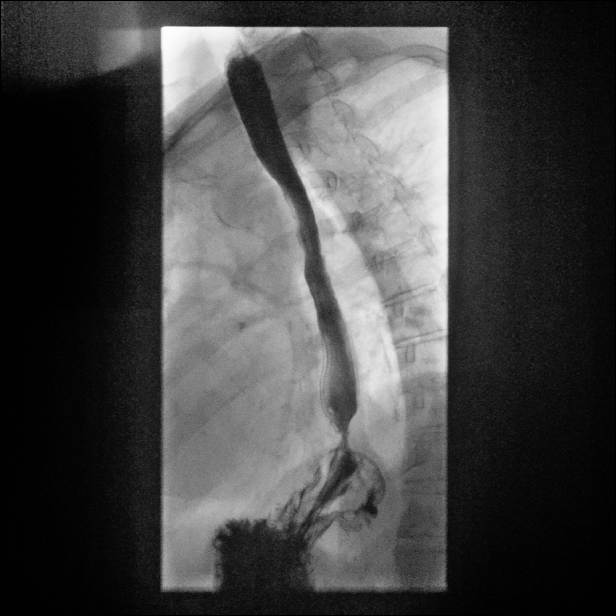

[Series 10: sequence · 1 of 48 frames shown (7 of 7)]
[frame 9/48]
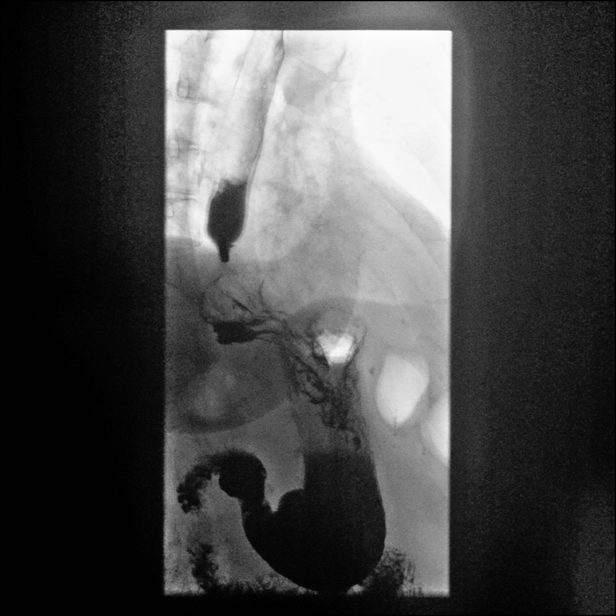

[Series 11: one shot · 1 of 1 slices shown (3 of 3)]
[im 1/1]
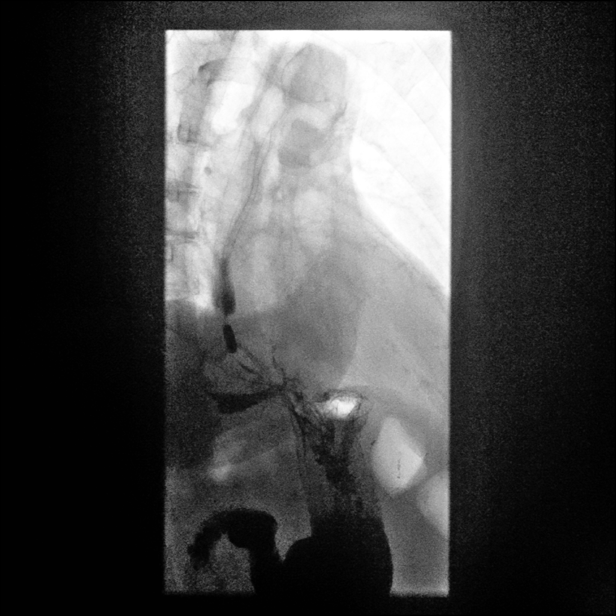

[14 of 24 positions shown; findings below may reference images not displayed]

FINDINGS: Pre-procedure KUB shows a normal bowel gas pattern.

Frontal and lateral views of the hypopharynx while swallowing
demonstrate prominent cricopharyngeus impression. Otherwise
unremarkable.

Double contrast imaging of the esophagus shows no evidence for
diverticulum or gross mass lesion. No mucosal ulceration evident. A
smooth but relatively abrupt stricture is identified at the
esophagogastric junction with associated small hiatal hernia.
Evaluation of esophageal motility reveals disruption of primary
peristalsis in the middle and distal third of the esophagus with
mild tertiary contractions. No presbyesophagus.

Double contrast imaging of the stomach shows no gross mass lesion.
Small hiatal hernia again noted. Gastric emptying is prompt.
Duodenal bulb normal on single and double contrast imaging. Duodenal
C-loop normally position.

13 mm barium tablet becomes lodged at the distal esophageal
stricture despite repeated swallows of water and thin barium.
IMPRESSION: 1. Distal esophageal stricture obstructs passage of a 13 mm barium
tablet. Stricture has smooth but relatively abrupt shoulders and
appearance favors benign etiology but upper endoscopy could be used
to exclude neoplasm.
2. Nonspecific esophageal motility disorder.
3. Small hiatal hernia.

## 2019-09-06 DIAGNOSIS — Z961 Presence of intraocular lens: Secondary | ICD-10-CM | POA: Diagnosis not present

## 2019-09-06 DIAGNOSIS — H5213 Myopia, bilateral: Secondary | ICD-10-CM | POA: Diagnosis not present

## 2019-10-25 ENCOUNTER — Ambulatory Visit: Payer: Medicare Other | Admitting: Family Medicine

## 2019-11-22 ENCOUNTER — Ambulatory Visit: Payer: Medicare Other | Admitting: Family Medicine

## 2019-11-22 ENCOUNTER — Other Ambulatory Visit: Payer: Self-pay

## 2019-12-13 ENCOUNTER — Other Ambulatory Visit: Payer: Self-pay | Admitting: Family Medicine

## 2019-12-19 ENCOUNTER — Ambulatory Visit (INDEPENDENT_AMBULATORY_CARE_PROVIDER_SITE_OTHER): Payer: Medicare Other | Admitting: Family Medicine

## 2019-12-19 ENCOUNTER — Other Ambulatory Visit: Payer: Self-pay

## 2019-12-19 ENCOUNTER — Encounter: Payer: Self-pay | Admitting: Family Medicine

## 2019-12-19 VITALS — BP 140/66 | HR 75 | Temp 97.5°F | Ht 62.0 in | Wt 158.2 lb

## 2019-12-19 DIAGNOSIS — I10 Essential (primary) hypertension: Secondary | ICD-10-CM

## 2019-12-19 DIAGNOSIS — Z8673 Personal history of transient ischemic attack (TIA), and cerebral infarction without residual deficits: Secondary | ICD-10-CM

## 2019-12-19 DIAGNOSIS — E559 Vitamin D deficiency, unspecified: Secondary | ICD-10-CM

## 2019-12-19 DIAGNOSIS — E7800 Pure hypercholesterolemia, unspecified: Secondary | ICD-10-CM

## 2019-12-19 MED ORDER — LISINOPRIL 10 MG PO TABS: 10.0000 mg | ORAL_TABLET | Freq: Every day | ORAL | 3 refills | Status: DC

## 2019-12-19 MED ORDER — VITAMIN D (ERGOCALCIFEROL) 1.25 MG (50000 UNIT) PO CAPS: 50000.0000 [IU] | ORAL_CAPSULE | ORAL | 3 refills | Status: DC

## 2019-12-19 MED ORDER — ATORVASTATIN CALCIUM 20 MG PO TABS: 20.0000 mg | ORAL_TABLET | Freq: Every day | ORAL | 3 refills | Status: DC

## 2019-12-19 MED ORDER — CLOPIDOGREL BISULFATE 75 MG PO TABS: 75.0000 mg | ORAL_TABLET | Freq: Every day | ORAL | 3 refills | Status: DC

## 2019-12-19 NOTE — Progress Notes (Signed)
Assessment & Plan:  1. Essential hypertension - Well controlled on current regimen.  - lisinopril (ZESTRIL) 10 MG tablet; Take 1 tablet (10 mg total) by mouth daily.  Dispense: 90 tablet; Refill: 3  2. Pure hypercholesterolemia - Well controlled on current regimen.  - atorvastatin (LIPITOR) 20 MG tablet; Take 1 tablet (20 mg total) by mouth daily at 6 PM.  Dispense: 90 tablet; Refill: 3 - CMP14+EGFR - Lipid panel  3. History of stroke - On statin. Discussed risks and benefits of statin therapy. Patient agreeable to continue.  - atorvastatin (LIPITOR) 20 MG tablet; Take 1 tablet (20 mg total) by mouth daily at 6 PM.  Dispense: 90 tablet; Refill: 3 - clopidogrel (PLAVIX) 75 MG tablet; Take 1 tablet (75 mg total) by mouth daily.  Dispense: 90 tablet; Refill: 3 - CBC with Differential/Platelet - Lipid panel  4. Vitamin D deficiency - Well controlled on current regimen.  - Vitamin D, Ergocalciferol, (DRISDOL) 1.25 MG (50000 UNIT) CAPS capsule; Take 1 capsule (50,000 Units total) by mouth every 7 (seven) days.  Dispense: 12 capsule; Refill: 3 - VITAMIN D 25 Hydroxy (Vit-D Deficiency, Fractures)   Return in about 6 months (around 06/20/2020).  Hendricks Limes, MSN, APRN, FNP-C [facility]  Subjective:    Patient ID: Brittany Osborn, female    DOB: 11-20-1945, 74 y.o.   MRN: [id number]  Patient Care Team: Loman Brooklyn, FNP as PCP - General (Family Medicine) Carol Ada, MD as Consulting Physician (Gastroenterology)   Chief Complaint:  Chief Complaint  Patient presents with  . Hyperlipidemia    check up of chronic medical conditions  . Hypertension    HPI: Brittany Osborn is a 74 y.o. female presenting on 12/19/2019 for Hyperlipidemia (check up of chronic medical conditions) and Hypertension  Patient has no complaints today. She does want to make sure Lipitor is safe for her to take as she received something in the mail stating it was dangerous.   She has  started doing beginners Tia [employer] for exercise.   New complaints: None  Social history:  Relevant past medical, surgical, family and social history reviewed and updated as indicated. Interim medical history since our last visit reviewed.  Allergies and medications reviewed and updated.  DATA REVIEWED: CHART IN EPIC  ROS: Negative unless specifically indicated above in HPI.    Current Outpatient Medications:  .  acetaminophen (TYLENOL) 500 MG tablet, Take 1,000 mg by mouth daily as needed for moderate pain or headache., Disp: , Rfl:  .  aspirin EC 81 MG tablet, Take 81 mg by mouth daily., Disp: , Rfl:  .  atorvastatin (LIPITOR) 20 MG tablet, TAKE 1 TABLET (20 MG TOTAL) BY MOUTH DAILY AT 6 PM., Disp: 90 tablet, Rfl: 0 .  clopidogrel (PLAVIX) 75 MG tablet, Take 1 tablet (75 mg total) by mouth daily., Disp: 90 tablet, Rfl: 1 .  docusate sodium (COLACE) 100 MG capsule, Take 100 mg by mouth daily as needed for mild constipation., Disp: , Rfl:  .  lisinopril (ZESTRIL) 10 MG tablet, Take 1 tablet (10 mg total) by mouth daily., Disp: 90 tablet, Rfl: 1 .  meclizine (ANTIVERT) 12.5 MG tablet, Take 1 tablet (12.5 mg total) by mouth 3 (three) times daily as needed for dizziness., Disp: 30 tablet, Rfl: 2 .  Melatonin 3 MG CAPS, Take 3 mg by mouth 2 (two) times daily as needed (anxiety/jitters)., Disp: , Rfl:  .  Vitamin D, Ergocalciferol, (DRISDOL) 1.25 MG (50000  UNIT) CAPS capsule, Take 1 capsule (50,000 Units total) by mouth every 7 (seven) days., Disp: 12 capsule, Rfl: 1   Allergies  Allergen Reactions  . Strawberry Extract Anaphylaxis  . Ciprofloxacin Swelling  . Pneumococcal Vaccine Swelling       . Sulfa Drugs Cross Reactors Nausea And Vomiting  . Citalopram     Agitation/anger   . Codeine Other (See Comments)    Headache   . Other     general anesthesia - difficulty waking up   Past Medical History:  Diagnosis Date  . Anxiety   . Arthritis   . Cataracts, bilateral    Corrected  with surgery  . Complication of anesthesia    slow to awaken 6 yrs ago after ruptured hiatal hernia surgery  . Family history of adverse reaction to anesthesia    women in family hard to awaken  . GERD (gastroesophageal reflux disease)   . H/O hiatal hernia   . Headache(784.0)   . Hyperlipidemia   . Hypertension   . Stroke [facility]) 09/2018 and old cva not sure when   09-2018 took tpa for, no residual defecits on plavix    Past Surgical History:  Procedure Laterality Date  . BIOPSY  02/21/2019   Procedure: BIOPSY;  Surgeon: Alphonsa Overall, MD;  Location: [facility];  Service: General;;  . CESAREAN SECTION    . COLONOSCOPY  09/16/2011   Procedure: COLONOSCOPY;  Surgeon: Beryle Beams, MD;  Location: [place];  Service: Endoscopy;  Laterality: N/A;  . ESOPHAGOGASTRODUODENOSCOPY  09/16/2011   Procedure: ESOPHAGOGASTRODUODENOSCOPY (EGD);  Surgeon: Beryle Beams, MD;  Location: [facility] ENDOSCOPY;  Service: Endoscopy;  Laterality: N/A;  Per Dr. Benson Norway, pt wants do procedures at [facility] instead of [facility]  . ESOPHAGOGASTRODUODENOSCOPY N/A 02/21/2019   Procedure: upper endoscopy with possible dilation;  Surgeon: Alphonsa Overall, MD;  Location: [facility];  Service: General;  Laterality: N/A;  . EYE SURGERY     cataracts both eyes  . LAPAROSCOPIC NISSEN FUNDOPLICATION N/A 5/64/3329   Procedure: LAPAROSCOPIC NISSEN FUNDOPLICATION laparoscopic hiatal hernia repair  ;  Surgeon: Shann Medal, MD;  Location: [facility];  Service: General;  Laterality: N/A;  . TONSILLECTOMY  age 42    Social History   Socioeconomic History  . Marital status: Widowed    Spouse name: Not on file  . Number of children: Not on file  . Years of education: Not on file  . Highest education level: Not on file  Occupational History  . Not on file  Tobacco Use  . Smoking status: Never Smoker  . Smokeless tobacco: Never Used  Vaping Use  . Vaping Use: Never used  Substance and Sexual Activity  . Alcohol use: No  . Drug use: No  .  Sexual activity: Not Currently  Other Topics Concern  . Not on file  Social History Narrative   [place]  Lives with  son. She worked as an Optometrist and a Oceanographer, retired 10 years ago.    Social Determinants of Health   Financial Resource Strain:   . Difficulty of Paying Living Expenses:   Food Insecurity:   . Worried About Charity fundraiser in the Last Year:   . Arboriculturist in the Last Year:   Transportation Needs:   . Film/video editor (Medical):   Marland Kitchen Lack of Transportation (Non-Medical):   Physical Activity:   . Days of Exercise per Week:   . Minutes of Exercise per Session:  Stress:   . Feeling of Stress :   Social Connections:   . Frequency of Communication with Friends and Family:   . Frequency of Social Gatherings with Friends and Family:   . Attends Religious Services:   . Active Member of Clubs or Organizations:   . Attends Archivist Meetings:   Marland Kitchen Marital Status:   Intimate Partner Violence:   . Fear of Current or Ex-Partner:   . Emotionally Abused:   Marland Kitchen Physically Abused:   . Sexually Abused:         Objective:    BP (!) 140/66   Pulse 75   Temp (!) 97.5 F (36.4 C) (Temporal)   Ht _0  (1.575 m)   Wt 158 lb 3.2 oz (71.8 kg)   SpO2 97%   BMI 28.94 kg/m   Wt Readings from Last 3 Encounters:  12/19/19 158 lb 3.2 oz (71.8 kg)  07/26/19 157 lb 2 oz (71.3 kg)  07/11/19 157 lb (71.2 kg)    Physical Exam Vitals reviewed.  Constitutional:      General: She is not in acute distress.    Appearance: Normal appearance. She is overweight. She is not ill-appearing, toxic-appearing or diaphoretic.  HENT:     Head: Normocephalic and atraumatic.  Eyes:     General: No scleral icterus.       Right eye: No discharge.        Left eye: No discharge.     Conjunctiva/sclera: Conjunctivae normal.  Cardiovascular:     Rate and Rhythm: Normal rate and regular rhythm.     Heart sounds: Normal heart sounds. No murmur heard.  No  friction rub. No gallop.   Pulmonary:     Effort: Pulmonary effort is normal. No respiratory distress.     Breath sounds: Normal breath sounds. No stridor. No wheezing, rhonchi or rales.  Musculoskeletal:        General: Normal range of motion.     Cervical back: Normal range of motion.  Skin:    General: Skin is warm and dry.     Capillary Refill: Capillary refill takes less than 2 seconds.  Neurological:     General: No focal deficit present.     Mental Status: She is alert and oriented to person, place, and time. Mental status is at baseline.  Psychiatric:        Mood and Affect: Mood normal.        Behavior: Behavior normal.        Thought Content: Thought content normal.        Judgment: Judgment normal.     No results found for: TSH Lab Results  Component Value Date   WBC 4.8 10/21/2018   HGB 13.3 10/21/2018   HCT 40.0 10/21/2018   MCV 96.6 10/21/2018   PLT 197 10/21/2018   Lab Results  Component Value Date   NA 136 10/21/2018   K 4.1 10/21/2018   CO2 26 10/21/2018   GLUCOSE 99 10/21/2018   BUN 12 10/21/2018   CREATININE 0.77 10/21/2018   BILITOT 0.8 10/16/2018   ALKPHOS 53 10/16/2018   AST 21 10/16/2018   ALT 18 10/16/2018   PROT 6.8 10/16/2018   ALBUMIN 4.2 10/16/2018   CALCIUM 8.7 (L) 10/21/2018   ANIONGAP 6 10/21/2018   Lab Results  Component Value Date   CHOL 162 10/17/2018   Lab Results  Component Value Date   HDL 49 10/17/2018   Lab Results  Component Value Date  LDLCALC 101 (H) 10/17/2018   Lab Results  Component Value Date   TRIG 59 10/17/2018   Lab Results  Component Value Date   CHOLHDL 3.3 10/17/2018   Lab Results  Component Value Date   HGBA1C 5.2 10/17/2018

## 2019-12-20 LAB — CMP14+EGFR
ALT: 15 IU/L (ref 0–32)
AST: 14 IU/L (ref 0–40)
Albumin/Globulin Ratio: 1.6 (ref 1.2–2.2)
Albumin: 4.2 g/dL (ref 3.7–4.7)
Alkaline Phosphatase: 60 IU/L (ref 48–121)
BUN/Creatinine Ratio: 12 (ref 12–28)
BUN: 10 mg/dL (ref 8–27)
Bilirubin Total: 0.5 mg/dL (ref 0.0–1.2)
CO2: 27 mmol/L (ref 20–29)
Calcium: 9.5 mg/dL (ref 8.7–10.3)
Chloride: 108 mmol/L — ABNORMAL HIGH (ref 96–106)
Creatinine, Ser: 0.83 mg/dL (ref 0.57–1.00)
GFR calc Af Amer: 81 mL/min/{1.73_m2} (ref >59)
GFR calc non Af Amer: 71 mL/min/{1.73_m2} (ref >59)
Globulin, Total: 2.7 g/dL (ref 1.5–4.5)
Glucose: 96 mg/dL (ref 65–99)
Potassium: 4.8 mmol/L (ref 3.5–5.2)
Sodium: 147 mmol/L — ABNORMAL HIGH (ref 134–144)
Total Protein: 6.9 g/dL (ref 6.0–8.5)

## 2019-12-20 LAB — CBC WITH DIFFERENTIAL/PLATELET
Basophils Absolute: 0 10*3/uL (ref 0.0–0.2)
Basos: 1 %
EOS (ABSOLUTE): 0.1 10*3/uL (ref 0.0–0.4)
Eos: 2 %
Hematocrit: 42.5 % (ref 34.0–46.6)
Hemoglobin: 14.4 g/dL (ref 11.1–15.9)
Immature Grans (Abs): 0 10*3/uL (ref 0.0–0.1)
Immature Granulocytes: 0 %
Lymphocytes Absolute: 1.2 10*3/uL (ref 0.7–3.1)
Lymphs: 35 %
MCH: 32.7 pg (ref 26.6–33.0)
MCHC: 33.9 g/dL (ref 31.5–35.7)
MCV: 96 fL (ref 79–97)
Monocytes Absolute: 0.3 10*3/uL (ref 0.1–0.9)
Monocytes: 8 %
Neutrophils Absolute: 2 10*3/uL (ref 1.4–7.0)
Neutrophils: 54 %
Platelets: 222 10*3/uL (ref 150–450)
RBC: 4.41 x10E6/uL (ref 3.77–5.28)
RDW: 11.7 % (ref 11.7–15.4)
WBC: 3.6 10*3/uL (ref 3.4–10.8)

## 2019-12-20 LAB — LIPID PANEL
Chol/HDL Ratio: 2.2 ratio (ref 0.0–4.4)
Cholesterol, Total: 132 mg/dL (ref 100–199)
HDL: 59 mg/dL (ref >39)
LDL Chol Calc (NIH): 59 mg/dL (ref 0–99)
Triglycerides: 69 mg/dL (ref 0–149)
VLDL Cholesterol Cal: 14 mg/dL (ref 5–40)

## 2019-12-20 LAB — VITAMIN D 25 HYDROXY (VIT D DEFICIENCY, FRACTURES): Vit D, 25-Hydroxy: 49.6 ng/mL (ref 30.0–100.0)

## 2019-12-22 ENCOUNTER — Encounter: Payer: Self-pay | Admitting: Family Medicine

## 2020-05-12 ENCOUNTER — Encounter: Payer: Self-pay | Admitting: Family Medicine

## 2020-05-12 ENCOUNTER — Other Ambulatory Visit: Payer: Self-pay

## 2020-05-12 ENCOUNTER — Ambulatory Visit (INDEPENDENT_AMBULATORY_CARE_PROVIDER_SITE_OTHER): Payer: Medicare Other | Admitting: Family Medicine

## 2020-05-12 ENCOUNTER — Ambulatory Visit (INDEPENDENT_AMBULATORY_CARE_PROVIDER_SITE_OTHER): Payer: Medicare Other

## 2020-05-12 VITALS — BP 127/70 | HR 69 | Temp 97.3°F | Ht 62.0 in | Wt 160.0 lb

## 2020-05-12 DIAGNOSIS — E7800 Pure hypercholesterolemia, unspecified: Secondary | ICD-10-CM | POA: Diagnosis not present

## 2020-05-12 DIAGNOSIS — R601 Generalized edema: Secondary | ICD-10-CM | POA: Diagnosis not present

## 2020-05-12 DIAGNOSIS — R600 Localized edema: Secondary | ICD-10-CM

## 2020-05-12 DIAGNOSIS — J811 Chronic pulmonary edema: Secondary | ICD-10-CM | POA: Diagnosis not present

## 2020-05-12 NOTE — Progress Notes (Signed)
Subjective:  Patient ID: Brittany Osborn, female    DOB: 11-Dec-1945  Age: 74 y.o. MRN: [id number]  CC: Edema (Feet, ankles and hands/)   HPI Brittany Osborn presents for swelling in her feet that is making her shoes fit too tight.  There is more swelling on the left than the right.  She says this is been ongoing for about 3 weeks.  Of note is that she had a stroke May 19 of last year 2020.  Since that time she is also had left leg pain intermittently.  She says this is different from her usual arthritis pain.  She denies dyspnea.  Depression screen [facility] 2/9 05/12/2020 12/19/2019 07/26/2019  Decreased Interest 0 0 0  Down, Depressed, Hopeless 0 0 1  PHQ - 2 Score 0 0 1  Altered sleeping - - -  Tired, decreased energy - - -  Change in appetite - - -  Feeling bad or failure about yourself  - - -  Trouble concentrating - - -  Moving slowly or fidgety/restless - - -  Suicidal thoughts - - -  PHQ-9 Score - - -  Difficult doing work/chores - - -    History Mahari has a past medical history of Anxiety, Arthritis, Cataracts, bilateral, Complication of anesthesia, Family history of adverse reaction to anesthesia, GERD (gastroesophageal reflux disease), H/O hiatal hernia, Headache(784.0), Hyperlipidemia, Hypertension, and Stroke ([place]) (09/2018 and old cva not sure when).   She has a past surgical history that includes Esophagogastroduodenoscopy (09/16/2011); Colonoscopy (09/16/2011); Cesarean section; Laparoscopic Nissen fundoplication (N/A, 9/45/0388); Eye surgery; Tonsillectomy (age 41); Esophagogastroduodenoscopy (N/A, 02/21/2019); and biopsy (02/21/2019).   Her family history includes Alcohol abuse in her brother; Heart disease in her mother; Lung cancer in her father.She reports that she has never smoked. She has never used smokeless tobacco. She reports that she does not drink alcohol and does not use drugs.    ROS Review of Systems  Constitutional: Negative.   HENT: Negative.   Eyes: Negative for  visual disturbance.  Respiratory: Negative for shortness of breath.   Cardiovascular: Positive for leg swelling. Negative for chest pain.  Gastrointestinal: Negative for abdominal pain.  Musculoskeletal: Negative for arthralgias.    Objective:  BP 127/70   Pulse 69   Temp (!) 97.3 F (36.3 C) (Temporal)   Ht 5' 2"  (1.575 m)   Wt 160 lb (72.6 kg)   BMI 29.26 kg/m   BP Readings from Last 3 Encounters:  05/12/20 127/70  12/19/19 (!) 140/66  07/26/19 119/69    Wt Readings from Last 3 Encounters:  05/12/20 160 lb (72.6 kg)  12/19/19 158 lb 3.2 oz (71.8 kg)  07/26/19 157 lb 2 oz (71.3 kg)     Physical Exam Constitutional:      General: She is not in acute distress.    Appearance: She is well-developed.  HENT:     Head: Normocephalic and atraumatic.  Neck:     Thyroid: No thyromegaly.  Cardiovascular:     Rate and Rhythm: Normal rate and regular rhythm.     Heart sounds: Normal heart sounds. No murmur heard.   Pulmonary:     Effort: Pulmonary effort is normal. No respiratory distress.     Breath sounds: Normal breath sounds. No wheezing or rales.  Abdominal:     Palpations: Abdomen is soft.     Tenderness: There is no abdominal tenderness.  Musculoskeletal:        General: Normal range of motion.  Cervical back: Normal range of motion and neck supple.     Right lower leg: Edema (1-2+ at ankle ) present.     Left lower leg: Edema (2 at ankle) present.  Skin:    General: Skin is warm and dry.  Neurological:     Mental Status: She is alert and oriented to person, place, and time.  Psychiatric:        Behavior: Behavior normal.        Thought Content: Thought content normal.        Judgment: Judgment normal.       Assessment & Plan:   Brittany Osborn was seen today for edema.  Diagnoses and all orders for this visit:  Localized edema -     CBC with Differential/Platelet -     CMP14+EGFR -     Lipid panel -     Pro b natriuretic peptide -     DG Chest 2  View; Future       I am having Brittany Osborn maintain her Melatonin, docusate sodium, acetaminophen, aspirin EC, meclizine, atorvastatin, clopidogrel, lisinopril, and Vitamin D (Ergocalciferol).  Allergies as of 05/12/2020      Reactions   Strawberry Extract Anaphylaxis   Ciprofloxacin Swelling   Pneumococcal Vaccine Swelling      Sulfa Drugs Cross Reactors Nausea And Vomiting   Citalopram    Agitation/anger    Codeine Other (See Comments)   Headache   Other    general anesthesia - difficulty waking up      Medication List       Accurate as of May 12, 2020 11:59 PM. If you have any questions, ask your nurse or doctor.        acetaminophen 500 MG tablet Commonly known as: TYLENOL Take 1,000 mg by mouth daily as needed for moderate pain or headache.   aspirin EC 81 MG tablet Take 81 mg by mouth daily.   atorvastatin 20 MG tablet Commonly known as: LIPITOR Take 1 tablet (20 mg total) by mouth daily at 6 PM.   clopidogrel 75 MG tablet Commonly known as: PLAVIX Take 1 tablet (75 mg total) by mouth daily.   docusate sodium 100 MG capsule Commonly known as: COLACE Take 100 mg by mouth daily as needed for mild constipation.   lisinopril 10 MG tablet Commonly known as: ZESTRIL Take 1 tablet (10 mg total) by mouth daily.   meclizine 12.5 MG tablet Commonly known as: ANTIVERT Take 1 tablet (12.5 mg total) by mouth 3 (three) times daily as needed for dizziness.   Melatonin 3 MG Caps Take 3 mg by mouth 2 (two) times daily as needed (anxiety/jitters).   Vitamin D (Ergocalciferol) 1.25 MG (50000 UNIT) Caps capsule Commonly known as: DRISDOL Take 1 capsule (50,000 Units total) by mouth every 7 (seven) days.        Follow-up: Return in about 3 months (around 08/10/2020), or if swelling or other symptoms worsen or fail to improve.  Claretta Fraise, M.D.

## 2020-05-13 LAB — CMP14+EGFR
ALT: 14 IU/L (ref 0–32)
AST: 18 IU/L (ref 0–40)
Albumin/Globulin Ratio: 1.5 (ref 1.2–2.2)
Albumin: 4.1 g/dL (ref 3.7–4.7)
Alkaline Phosphatase: 65 IU/L (ref 44–121)
BUN/Creatinine Ratio: 12 (ref 12–28)
BUN: 10 mg/dL (ref 8–27)
Bilirubin Total: 0.6 mg/dL (ref 0.0–1.2)
CO2: 26 mmol/L (ref 20–29)
Calcium: 9.5 mg/dL (ref 8.7–10.3)
Chloride: 104 mmol/L (ref 96–106)
Creatinine, Ser: 0.85 mg/dL (ref 0.57–1.00)
GFR calc Af Amer: 79 mL/min/{1.73_m2} (ref >59)
GFR calc non Af Amer: 69 mL/min/{1.73_m2} (ref >59)
Globulin, Total: 2.8 g/dL (ref 1.5–4.5)
Glucose: 94 mg/dL (ref 65–99)
Potassium: 5.3 mmol/L — ABNORMAL HIGH (ref 3.5–5.2)
Sodium: 141 mmol/L (ref 134–144)
Total Protein: 6.9 g/dL (ref 6.0–8.5)

## 2020-05-13 LAB — CBC WITH DIFFERENTIAL/PLATELET
Basophils Absolute: 0 10*3/uL (ref 0.0–0.2)
Basos: 1 %
EOS (ABSOLUTE): 0.1 10*3/uL (ref 0.0–0.4)
Eos: 3 %
Hematocrit: 40.1 % (ref 34.0–46.6)
Hemoglobin: 14 g/dL (ref 11.1–15.9)
Immature Grans (Abs): 0 10*3/uL (ref 0.0–0.1)
Immature Granulocytes: 0 %
Lymphocytes Absolute: 1.4 10*3/uL (ref 0.7–3.1)
Lymphs: 32 %
MCH: 33.5 pg — ABNORMAL HIGH (ref 26.6–33.0)
MCHC: 34.9 g/dL (ref 31.5–35.7)
MCV: 96 fL (ref 79–97)
Monocytes Absolute: 0.3 10*3/uL (ref 0.1–0.9)
Monocytes: 8 %
Neutrophils Absolute: 2.5 10*3/uL (ref 1.4–7.0)
Neutrophils: 56 %
Platelets: 226 10*3/uL (ref 150–450)
RBC: 4.18 x10E6/uL (ref 3.77–5.28)
RDW: 11.7 % (ref 11.7–15.4)
WBC: 4.3 10*3/uL (ref 3.4–10.8)

## 2020-05-13 LAB — LIPID PANEL
Chol/HDL Ratio: 2.4 ratio (ref 0.0–4.4)
Cholesterol, Total: 142 mg/dL (ref 100–199)
HDL: 59 mg/dL (ref >39)
LDL Chol Calc (NIH): 66 mg/dL (ref 0–99)
Triglycerides: 92 mg/dL (ref 0–149)
VLDL Cholesterol Cal: 17 mg/dL (ref 5–40)

## 2020-05-13 LAB — PRO B NATRIURETIC PEPTIDE: NT-Pro BNP: 154 pg/mL (ref 0–301)

## 2020-05-13 NOTE — Progress Notes (Signed)
Hello Brittany Osborn,  Your lab result is normal and/or stable.Some minor variations that are not significant are commonly marked abnormal, but do not represent any medical problem for you.  Best regards,  , M.D.

## 2020-05-13 NOTE — Progress Notes (Signed)
Your chest x-ray looked normal. Thanks, WS.

## 2020-05-18 ENCOUNTER — Other Ambulatory Visit: Payer: Self-pay | Admitting: Family Medicine

## 2020-05-18 ENCOUNTER — Telehealth: Payer: Self-pay

## 2020-05-18 DIAGNOSIS — R601 Generalized edema: Secondary | ICD-10-CM

## 2020-05-18 MED ORDER — FUROSEMIDE 20 MG PO TABS: 20.0000 mg | ORAL_TABLET | Freq: Every day | ORAL | 0 refills | Status: DC

## 2020-05-18 NOTE — Telephone Encounter (Signed)
Patient notified of results.

## 2020-05-20 ENCOUNTER — Encounter: Payer: Self-pay | Admitting: Family Medicine

## 2020-05-21 ENCOUNTER — Ambulatory Visit: Payer: Medicare Other | Admitting: Family Medicine

## 2020-06-08 ENCOUNTER — Ambulatory Visit: Payer: Medicare Other | Admitting: Family Medicine

## 2020-06-10 ENCOUNTER — Ambulatory Visit: Payer: Medicare Other | Admitting: Family Medicine

## 2020-06-10 DIAGNOSIS — R1319 Other dysphagia: Secondary | ICD-10-CM | POA: Diagnosis not present

## 2020-06-10 DIAGNOSIS — Z9889 Other specified postprocedural states: Secondary | ICD-10-CM | POA: Diagnosis not present

## 2020-06-16 ENCOUNTER — Encounter: Payer: Self-pay | Admitting: Family Medicine

## 2020-06-16 ENCOUNTER — Telehealth (INDEPENDENT_AMBULATORY_CARE_PROVIDER_SITE_OTHER): Payer: Medicare Other | Admitting: Family Medicine

## 2020-06-16 DIAGNOSIS — R600 Localized edema: Secondary | ICD-10-CM

## 2020-06-16 DIAGNOSIS — R609 Edema, unspecified: Secondary | ICD-10-CM | POA: Diagnosis not present

## 2020-06-16 DIAGNOSIS — I10 Essential (primary) hypertension: Secondary | ICD-10-CM

## 2020-06-16 NOTE — Progress Notes (Signed)
Virtual Visit via telephone Note Due to COVID-19 pandemic this visit was conducted virtually via telephone. This visit type was conducted due to national recommendations for restrictions regarding the COVID-19 Pandemic (e.g. social distancing, sheltering in place) in an effort to limit this patient's exposure and mitigate transmission in our community. All issues noted in this document were discussed and addressed.  A physical exam was not performed with this format.  I connected with Brittany Osborn on 06/16/20 at 1149 by telephone and verified that I am speaking with the correct person using two identifiers. Brittany Osborn is currently located at home and no one is currently with her during visit. The provider, Gwenlyn Perking, FNP is located in their home at time of visit.  I discussed the limitations, risks, security and privacy concerns of performing an evaluation and management service by telephone and the availability of in person appointments. I also discussed with the patient that there may be a patient responsible charge related to this service. The patient expressed understanding and agreed to proceed.  CC: leg edema  History and Present Illness:  HPI  Brittany Osborn was seen in the office on 05/18/20 for edema. She had a normal chest xray and lab work at the time. She was given a short duration of lasix for edema. She did not take the lasix as she was nervous about the side effects. She reports that her leg edema is still present but has definitely improved. She has been elevating her feet and eating a low salt diet. She denies warmth, tenderness, or erythema to her legs. She reports that her BP at home is well controlled and at goal. She takes lisinopril daily. She denies blurred vision, weakness, fatigue, palpitations, or shortness of breath. She reports that she has occasional chest pain that lasts for a few minutes when she is stressed out. It goes away when she calms down. She reports that this  is not new for her. She denies nausea, jaw pain, sweating, or pain in her left arm.    ROS As per HPI.   Observations/Objective: Alert and oriented x 3. Able to speak in full sentences without difficulty.    Assessment and Plan: Brittany Osborn was seen today for leg swelling.  Diagnoses and all orders for this visit:  Peripheral edema Reviewed chest xray and labs from 05/18/20 visit. Edema has improved. Continue elevation, low salt diet, compression stocking. Return to office for new or worsening symptoms.  Essential hypertension Well controlled per patient at home. Last BP in office was at goal and lab work was stable. Return to office for new or worsening symptoms.  Follow Up Instructions: 3 months with PCP in office for chronic follow up, sooner for new or worsening symptoms.     I discussed the assessment and treatment plan with the patient. The patient was provided an opportunity to ask questions and all were answered. The patient agreed with the plan and demonstrated an understanding of the instructions.   The patient was advised to call back or seek an in-person evaluation if the symptoms worsen or if the condition fails to improve as anticipated.  The above assessment and management plan was discussed with the patient. The patient verbalized understanding of and has agreed to the management plan. Patient is aware to call the clinic if symptoms persist or worsen. Patient is aware when to return to the clinic for a follow-up visit. Patient educated on when it is appropriate to go to the emergency  department.   Time phone call ended:  [place], [place]

## 2020-06-16 NOTE — Patient Instructions (Signed)

## 2020-07-06 ENCOUNTER — Encounter: Payer: Self-pay | Admitting: Neurology

## 2020-07-06 ENCOUNTER — Ambulatory Visit: Payer: Medicare Other | Admitting: Neurology

## 2020-07-06 VITALS — BP 136/76 | HR 70 | Ht 62.0 in | Wt 161.2 lb

## 2020-07-06 DIAGNOSIS — Z8673 Personal history of transient ischemic attack (TIA), and cerebral infarction without residual deficits: Secondary | ICD-10-CM

## 2020-07-06 NOTE — Progress Notes (Signed)
[facility] [address] [place]. [place] [address] [phone]       OFFICE CONSULT NOTE  Ms. LATEFA HAYHURST Date of Birth:  12/05/1945 Medical Record Number:  [id number]   Referring MD: Ralene Ok  Reason for Referral: History of stroke clearance for surgery HPI: Ms. Jacober is a 75 year old Caucasian lady seen today for initial office follow-up visit for stroke and clearance for surgery.  History is obtained from the patient and her son Barnabas Lister is accompanying her and review of electronic medical records and I personally reviewed pertinent imaging films in PACS. She is referred to me today for neurological clearance for hernia surgery.  She had an episode in May 2020 when she developed sudden onset of cramping sensation and weakness in the left leg followed by weakness of the left arm and numbness on the left side of the body.  This occurred in the setting of significant ongoing stressors like her husband was sick and had hospice placement on that day.  She was seen in the ER and an NIH stroke scale of 3 and was given IV TPA.  She showed gradual improvement Po after admission and CT scan of the head and CT angiogram unremarkable subsequent MRI scan was obtained which showed an old right frontal remote age infarct but no acute infarct was noted.  2D echo showed ejection fraction of 6065%.  LDL cholesterol is 101 mg percent.  Hemoglobin A1c is 5.2.  Patient was started on aspirin Plavix for 3 weeks and then meant to be on Plavix alone.  She states she is done well since then has not had any further stroke or TIA symptoms.  However she remains on both aspirin and Plavix and does complain of easy bruising.  She had a fall a few weeks ago and had nasty bruise on her leg which is still healing.  She had some dysphagia and swallowing difficulty last year and had upper endoscopy and had stopped aspirin Plavix for several days before the procedure and resumed it after that.  She states her  blood pressures well controlled today at 136/76.  She has no new complaints.  She is tolerating Lipitor well without muscle aches and pains.  Dr. Rosendo Gros plans to do hernia surgery and wants neurological clearance for that. ROS:   14 system review of systems is positive for leg weakness, difficulty walking, slurred speech, difficulty swallowing, bruising all other systems negative  PMH:  Past Medical History:  Diagnosis Date  . Anxiety   . Arthritis   . Cataracts, bilateral    Corrected with surgery  . Complication of anesthesia    slow to awaken 6 yrs ago after ruptured hiatal hernia surgery  . Family history of adverse reaction to anesthesia    women in family hard to awaken  . GERD (gastroesophageal reflux disease)   . H/O hiatal hernia   . Headache(784.0)   . Hyperlipidemia   . Hypertension   . Stroke [facility]) 09/2018 and old cva not sure when   09-2018 took tpa for, no residual defecits on plavix    Social History:  Social History   Socioeconomic History  . Marital status: Widowed    Spouse name: Not on file  . Number of children: Not on file  . Years of education: Not on file  . Highest education level: Not on file  Occupational History  . Not on file  Tobacco Use  . Smoking status: Never Smoker  . Smokeless tobacco: Never  Used  Vaping Use  . Vaping Use: Never used  Substance and Sexual Activity  . Alcohol use: No  . Drug use: No  . Sexual activity: Not Currently  Other Topics Concern  . Not on file  Social History Narrative   Lives with son   Right Handed   Drinks very little caffeine, everything is decaffeinated   Social Determinants of Radio broadcast assistant Strain: Not on file  Food Insecurity: Not on file  Transportation Needs: Not on file  Physical Activity: Not on file  Stress: Not on file  Social Connections: Not on file  Intimate Partner Violence: Not on file    Medications:   Current Outpatient Medications on File Prior to Visit   Medication Sig Dispense Refill  . acetaminophen (TYLENOL) 500 MG tablet Take 1,000 mg by mouth daily as needed for moderate pain or headache.    Marland Kitchen aspirin EC 81 MG tablet Take 81 mg by mouth daily.    Marland Kitchen atorvastatin (LIPITOR) 20 MG tablet Take 1 tablet (20 mg total) by mouth daily at 6 PM. 90 tablet 3  . docusate sodium (COLACE) 100 MG capsule Take 100 mg by mouth daily as needed for mild constipation.    Marland Kitchen lisinopril (ZESTRIL) 10 MG tablet Take 1 tablet (10 mg total) by mouth daily. 90 tablet 3  . meclizine (ANTIVERT) 12.5 MG tablet Take 1 tablet (12.5 mg total) by mouth 3 (three) times daily as needed for dizziness. 30 tablet 2  . Melatonin 3 MG CAPS Take 3 mg by mouth 2 (two) times daily as needed (anxiety/jitters).    . Vitamin D, Ergocalciferol, (DRISDOL) 1.25 MG (50000 UNIT) CAPS capsule Take 1 capsule (50,000 Units total) by mouth every 7 (seven) days. 12 capsule 3   No current facility-administered medications on file prior to visit.    Allergies:   Allergies  Allergen Reactions  . Strawberry Extract Anaphylaxis  . Ciprofloxacin Swelling  . Pneumococcal Vaccine Swelling       . Sulfa Drugs Cross Reactors Nausea And Vomiting  . Citalopram     Agitation/anger   . Codeine Other (See Comments)    Headache   . Other     general anesthesia - difficulty waking up    Physical Exam General: well developed, well nourished elderly Caucasian lady, seated, in no evident distress Head: head normocephalic and atraumatic.   Neck: supple with no carotid or supraclavicular bruits Cardiovascular: regular rate and rhythm, no murmurs Musculoskeletal: no deformity Skin:  no rash/petichiae Vascular:  Normal pulses all extremities  Neurologic Exam Mental Status: Awake and fully alert. Oriented to place and time. Recent and remote memory intact. Attention span, concentration and fund of knowledge appropriate. Mood and affect appropriate.  Cranial Nerves: Fundoscopic exam reveals sharp disc  margins. Pupils equal, briskly reactive to light. Extraocular movements full without nystagmus. Visual fields full to confrontation. Hearing intact. Facial sensation intact. Face, tongue, palate moves normally and symmetrically.  Motor: Normal bulk and tone. Normal strength in all tested extremity muscles. Sensory.: intact to touch , pinprick , position and vibratory sensation.  Coordination: Rapid alternating movements normal in all extremities. Finger-to-nose and heel-to-shin performed accurately bilaterally. Gait and Station: Arises from chair without difficulty. Stance is normal. Gait demonstrates normal stride length and balance . Able to heel, toe and tandem walk with moderate difficulty.  Reflexes: 1+ and symmetric. Toes downgoing.   NIHSS  0 Modified Rankin  0  ASSESSMENT: 75 year old Caucasian lady with history of  strokelike episode in May 2020 treated with IV TPA who is done well.  Vascular risk factors of hyperlipidemia, hypertension and age alone     PLAN: I had a long d/w patient and her son about hier  Remote stroke like episode str, risk for recurrent stroke/TIAs, personally independently reviewed imaging studies and stroke evaluation results and answered questions.Continue aspirin 81 mg daily but stop plavix now  Due to bruising   for secondary stroke prevention and maintain strict control of hypertension with blood pressure goal below 130/90, diabetes with hemoglobin A1c goal below 6.5% and lipids with LDL cholesterol goal below 70 mg/dL. I also advised the patient to eat a healthy diet with plenty of whole grains, cereals, fruits and vegetables, exercise regularly and maintain ideal body weight. She is neurologically cleared for hernia surgery with a small but acceptable periprocedural risk of a stroke when she holds aspirin if acceptable to her. Followup in the future with me in  only as needed.  Greater than 50% time during this 45-minute consultation was it was spent on  counseling and coordination of care about a remote stroke and discussion about her periprocedural risk of stroke and TIA and answering questions. Antony Contras, MD  [facility] [address] [place], [place] [address]  Phone [phone] Fax [phone] Note: This document was prepared with digital dictation and possible smart phrase technology. Any transcriptional errors that result from this process are unintentional.

## 2020-07-06 NOTE — Patient Instructions (Signed)
I had a long d/w patient and her son about hier  Remote stroke like episode str, risk for recurrent stroke/TIAs, personally independently reviewed imaging studies and stroke evaluation results and answered questions.Continue aspirin 81 mg daily but stop plavix now  Due to bruising   for secondary stroke prevention and maintain strict control of hypertension with blood pressure goal below 130/90, diabetes with hemoglobin A1c goal below 6.5% and lipids with LDL cholesterol goal below 70 mg/dL. I also advised the patient to eat a healthy diet with plenty of whole grains, cereals, fruits and vegetables, exercise regularly and maintain ideal body weight. She is neurologically cleared for hernia surgery with a small but acceptable periprocedural risk of a stroke when she holds aspirin if acceptable to hjer. Followup in the future with me in  Only as needed.

## 2020-08-17 ENCOUNTER — Ambulatory Visit: Payer: Self-pay | Admitting: General Surgery

## 2020-08-17 DIAGNOSIS — R1319 Other dysphagia: Secondary | ICD-10-CM | POA: Diagnosis not present

## 2020-08-17 DIAGNOSIS — Z9884 Bariatric surgery status: Secondary | ICD-10-CM | POA: Diagnosis not present

## 2020-08-17 NOTE — H&P (Signed)
History of Present Illness Ralene Ok MD; 08/17/2020 3:37 PM) The patient is a 75 year old female who presents with a complaint of hernia. Patient follow back up today secondary to recurrent hiatal hernia. Patient continues with dysphagia. She again says that she has difficulty with meats and breads. Patient recently saw Dr. Leonie Man her neurologist. He has cleared her for surgery. She is currently on aspirin and no longer on Plavix.   ---------------------------  Patient is a 75 year old female, who comes in after a one-year follow-up for a recurrent hiatal hernia. The patient continues with dysphagia. She states that these are mainly with meats and breads. Patient has been managing her diet with foods that are easily go down. Patient states that she's had no issues with recurrent strokes at this time.  I discussed with the patient and her son that secondary to her strokes and her being on Plavix if her dysphagia symptoms continued to increase she may require surgery in the near future. Secondary to her strokes I would recommend neurology follow-up and clearance for surgery and/or future procedures needed prior to hiatal hernia repair.  ------------------------------- The PCP is [facility] (Dr. Gilles Chiquito) She is by herself.   [The Covid-19 virus has disrupted normal medical care in [place] and across the nation. We have sometimes had to alter normal surgical/medical care to limit this epidemic and we have explained these changes to the patient.]  Ms. Schlesinger is doing better since I last saw her. It seems some of her issues are social as much as physical. She had an argument with her daughter, has now moved in with her son, who is single. She says that her son is much like her husband and they get along well. She is still getting over her stroke. Unfortunately, it looks like she's had no follow-up with Dr. Leonie Man. I encouraged her to try to  reestablish a follow-up with the neurologist long term. She thinks part of her GI symptoms was due to swelling. 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 This extract helped her anxiety and sleep. Since she has stopped this, she is doing better. She is still depressed over her husband's death last spring and still working through this. She has gained 6 pounds since I last saw her. And she says her swallowing is better. I have given her Protonix for reflux/gastritis, which she is almost out of. She asked if she still needed this. I told her that she can try to control her symptoms with over-the-counter antiulcer medicine such as Pepcid. If this does not work, we can refill the prescription.  Plan: 1. Follow up in one year.   History of esophageal issues: I took care of Ms. Neidhardt in 11/12/2012 for a large hiatal hernia. The last time I saw her was 06/20/2013. Ms. Launer went through a fairly traumatic 10-25-2018. Her husband died in Oct 25, 2022 of vascular dementia. Some time this spring, predating her husband's death, she started having increasing dysphagia. Particularly with breads or heavy food, she felt that certain foods would not get down. Since her husband's death, she is estimated she vomits about once a week. She's guessing that she has lost about 20 pounds. Of note, when I did her surgery - she weighed 128 pounds. She now weighs 151 pounds. 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 She said that she passed out going to the hospital. A head CT scan  on 10/16/2018 showed hypodensity in the right posterior frontal lobe. She was hospitalized from 19 May - 18 Oct 2018 and treated with tPA. She still has some left left weakness. She walks with a cane. She is right handed. She was supposed to follow up with Dr. Leonie Man, but has not done that yet   She's here to see to evaluate her dysphagia.  Her UGI on 12/11/2018 showed a 1. Distal esophageal stricture obstructs passage of a 13 mm barium tablet. Stricture has smooth but relatively abrupt shoulders and appearance favors benign etiology but upper endoscopy could be used to exclude neoplasm. 2. Nonspecific esophageal motility disorder. 3. Small hiatal hernia. I did an upper endoscopy on 02/21/2019 - which showed Distal esophagitis, no esophageal stricture, 4 cm hiatal hernia, gastritis. Her CLO test was positive. I treated her H Pylori - but there is some question as to whether she took the meds correctly.  Review of Systems as stated in this history (HPI) or in the review of systems. Otherwise all other 12 point ROS are negative  Past Medical History: 1. She underwent a repair of a HH with Nissen fundoplication - 0/53/9767 - [place] She had a colonoscopy around the same time. She commented that she did not want to see Dr. Benson Norway back again. 2. Arthritis - hands and legs 3. Anxiety 4. Bronchitis since she was a child This resolved with her [facility] repair and has not come back since. 5. Stroke - 06-Nov-2018 Treated with tPA She was supposed to see Dr. Leonie Man back, but has not been able to follow up on that appt yet. 6. On plavix  Social History: Widowed. Husband Richie Vadala (DOB - 10/09/1939) - I did hernia surgery on him. He died of vascular dementia in November 06, 2018. She had a stroke about the same time. They had been married 36 years. She had an argument with her daughter - [name] [name] is married) - so she moved out to live with her son. Living with her son is doing much better. She has a son who is a Administrator. He drives for [employer], he delivers cooking oil to restaurants.   Allergies [facility] Jeannene Patella, [name]; 08/17/2020 3:20 PM) No Known Drug Allergies  [06/06/2019]: No Known Allergies   [06/10/2020]:  Medication History Altamese Cabal, [name]; 08/17/2020 3:20 PM) Lisinopril (10MG  Tablet, Oral) Active. Vitamin D (Ergocalciferol) (1.25 MG(50000 UT) Capsule, Oral) Active. Atorvastatin Calcium (20MG  Tablet, Oral) Active. Medications Reconciled    Physical Exam Ralene Ok, MD; 08/17/2020 3:39 PM) The physical exam findings are as follows: Note: General: WN older WF who is alert and generally healthy appearing. She has a flat affect. She is wearing a mask. HEENT: Normal. Pupils equal.  Neck: Supple. No mass. No thyroid mass.  Lymph Nodes: No supraclavicular or cervical nodes.  Lungs: Clear to auscultation and symmetric breath sounds. Heart: RRR. No murmur or rub.  Abdomen: Soft. No mass. No tenderness. No hernia. Normal bowel sounds.  Her abdominal incisions all look good.  Extremities: Good strength and ROM in upper and lower extremities.    Assessment & Plan Ralene Ok MD; 08/17/2020 3:39 PM) HISTORY OF NISSEN FUNDOPLICATION (H41.937) Story: HH repair and Nissen fundoplication - 01/30/4096 ESOPHAGEAL DYSPHAGIA (R13.19) Story: Upper endo - 02/21/2019 - [facility] - Distal esophagitis, no esophageal stricture, 4 cm hiatal hernia, gastritis Impression: Patient is a 75 year old female with a recurrent hiatal hernia, and dysphagia. 1. Will proceed to the operating room for a robotic hiatal hernia  repair with mesh and fundoplication  2. Discussed with patient the risks and benefits of the procedure to include but not limited to: Infection, bleeding, damage to structures, possible pneumothorax, possible recurrence. The patient voiced understanding and wishes to proceed.

## 2020-09-09 DIAGNOSIS — Z961 Presence of intraocular lens: Secondary | ICD-10-CM | POA: Diagnosis not present

## 2020-09-09 DIAGNOSIS — H5213 Myopia, bilateral: Secondary | ICD-10-CM | POA: Diagnosis not present

## 2020-09-15 ENCOUNTER — Other Ambulatory Visit: Payer: Self-pay

## 2020-09-15 ENCOUNTER — Encounter: Payer: Self-pay | Admitting: Family Medicine

## 2020-09-15 ENCOUNTER — Ambulatory Visit (INDEPENDENT_AMBULATORY_CARE_PROVIDER_SITE_OTHER): Payer: Medicare Other | Admitting: Family Medicine

## 2020-09-15 VITALS — BP 139/79 | HR 69 | Temp 97.3°F | Ht 62.0 in | Wt 157.0 lb

## 2020-09-15 DIAGNOSIS — E559 Vitamin D deficiency, unspecified: Secondary | ICD-10-CM | POA: Diagnosis not present

## 2020-09-15 DIAGNOSIS — K449 Diaphragmatic hernia without obstruction or gangrene: Secondary | ICD-10-CM | POA: Insufficient documentation

## 2020-09-15 DIAGNOSIS — R7309 Other abnormal glucose: Secondary | ICD-10-CM | POA: Diagnosis not present

## 2020-09-15 DIAGNOSIS — I10 Essential (primary) hypertension: Secondary | ICD-10-CM

## 2020-09-15 DIAGNOSIS — E7800 Pure hypercholesterolemia, unspecified: Secondary | ICD-10-CM | POA: Diagnosis not present

## 2020-09-15 NOTE — Progress Notes (Signed)
 Assessment & Plan:  1. Primary hypertension Slightly elevated today due to pain.  BP goal less than 130/90.  Patient to monitor at home and let me know if she finds her blood pressure to be consistently greater than 130/90. - BMP8+EGFR  2. Pure hypercholesterolemia Well controlled on current regimen.   3. Vitamin D deficiency Well controlled on current regimen.   4. Hiatal hernia Hernia surgery scheduled for the end of June.   Return in about 3 months (around 12/15/2020) for annual physical.   , MSN, APRN, FNP-C [facility]  Subjective:    Patient ID: Brittany Osborn, female    DOB: 07/04/1945, 74 y.o.   MRN: [id number]  Patient Care Team: ,  F, FNP as PCP - General (Family Medicine) Hung, Patrick, MD as Consulting Physician (Gastroenterology)   Chief Complaint:  Chief Complaint  Patient presents with  . Hypertension  . Hyperlipidemia    3 month follow up of chronic medical conditions     HPI: Brittany Osborn is a 74 y.o. female presenting on 09/15/2020 for Hypertension and Hyperlipidemia (3 month follow up of chronic medical conditions )  Hypertension: Patient does check her blood pressure at home and reports her systolic readings are generally 110-150 with the readings on the higher end being when she is in pain due to her hernia.  She is scheduled to have hernia surgery at the end of June.  New complaints: None  Social history:  Relevant past medical, surgical, family and social history reviewed and updated as indicated. Interim medical history since our last visit reviewed.  Allergies and medications reviewed and updated.  DATA REVIEWED: CHART IN EPIC  ROS: Negative unless specifically indicated above in HPI.    Current Outpatient Medications:  .  acetaminophen (TYLENOL) 500 MG tablet, Take 1,000 mg by mouth daily as needed for moderate pain or headache., Disp: , Rfl:  .  aspirin EC 81 MG tablet, Take 81 mg by mouth  daily., Disp: , Rfl:  .  atorvastatin (LIPITOR) 20 MG tablet, Take 1 tablet (20 mg total) by mouth daily at 6 PM., Disp: 90 tablet, Rfl: 3 .  docusate sodium (COLACE) 100 MG capsule, Take 100 mg by mouth daily as needed for mild constipation., Disp: , Rfl:  .  lisinopril (ZESTRIL) 10 MG tablet, Take 1 tablet (10 mg total) by mouth daily., Disp: 90 tablet, Rfl: 3 .  meclizine (ANTIVERT) 12.5 MG tablet, Take 1 tablet (12.5 mg total) by mouth 3 (three) times daily as needed for dizziness., Disp: 30 tablet, Rfl: 2 .  Melatonin 3 MG CAPS, Take 3 mg by mouth 2 (two) times daily as needed (anxiety/jitters)., Disp: , Rfl:  .  Vitamin D, Ergocalciferol, (DRISDOL) 1.25 MG (50000 UNIT) CAPS capsule, Take 1 capsule (50,000 Units total) by mouth every 7 (seven) days., Disp: 12 capsule, Rfl: 3   Allergies  Allergen Reactions  . Strawberry Extract Anaphylaxis  . Ciprofloxacin Swelling  . Pneumococcal Vaccine Swelling       . Sulfa Drugs Cross Reactors Nausea And Vomiting  . Citalopram     Agitation/anger   . Codeine Other (See Comments)    Headache   . Other     general anesthesia - difficulty waking up   Past Medical History:  Diagnosis Date  . Anxiety   . Arthritis   . Cataracts, bilateral    Corrected with surgery  . Complication of anesthesia    slow to awaken 6 yrs   ago after ruptured hiatal hernia surgery  . Family history of adverse reaction to anesthesia    women in family hard to awaken  . GERD (gastroesophageal reflux disease)   . H/O hiatal hernia   . Headache(784.0)   . Hyperlipidemia   . Hypertension   . Stroke [facility]) 09/2018 and old cva not sure when   09-2018 took tpa for, no residual defecits on plavix    Past Surgical History:  Procedure Laterality Date  . BIOPSY  02/21/2019   Procedure: BIOPSY;  Surgeon: Alphonsa Overall, MD;  Location: [facility];  Service: General;;  . CESAREAN SECTION    . COLONOSCOPY  09/16/2011   Procedure: COLONOSCOPY;  Surgeon: Beryle Beams, MD;   Location: [facility];  Service: Endoscopy;  Laterality: N/A;  . ESOPHAGOGASTRODUODENOSCOPY  09/16/2011   Procedure: ESOPHAGOGASTRODUODENOSCOPY (EGD);  Surgeon: Beryle Beams, MD;  Location: [facility] END[facility];  Service: Endoscopy;  Laterality: N/A;  Per Dr. Benson Norway, pt wants do procedures at [facility] instead of [facility]  . ESOPHAGOGASTRODUODENOSCOPY N/A 02/21/2019   Procedure: upper endoscopy with possible dilation;  Surgeon: Alphonsa Overall, MD;  Location: [facility];  Service: General;  Laterality: N/A;  . EYE SURGERY     cataracts both eyes  . LAPAROSCOPIC NISSEN FUNDOPLICATION N/A 7/51/7001   Procedure: LAPAROSCOPIC NISSEN FUNDOPLICATION laparoscopic hiatal hernia repair  ;  Surgeon: Shann Medal, MD;  Location: [facility];  Service: General;  Laterality: N/A;  . TONSILLECTOMY  age 84    Social History   Socioeconomic History  . Marital status: Widowed    Spouse name: Not on file  . Number of children: Not on file  . Years of education: Not on file  . Highest education level: Not on file  Occupational History  . Not on file  Tobacco Use  . Smoking status: Never Smoker  . Smokeless tobacco: Never Used  Vaping Use  . Vaping Use: Never used  Substance and Sexual Activity  . Alcohol use: No  . Drug use: No  . Sexual activity: Not Currently  Other Topics Concern  . Not on file  Social History Narrative   Lives with son   Right Handed   Drinks very little caffeine, everything is decaffeinated   Social Determinants of Radio broadcast assistant Strain: Not on file  Food Insecurity: Not on file  Transportation Needs: Not on file  Physical Activity: Not on file  Stress: Not on file  Social Connections: Not on file  Intimate Partner Violence: Not on file        Objective:    BP 139/79   Pulse 69   Temp (!) 97.3 F (36.3 C) (Temporal)   Ht 5' 2" (1.575 m)   Wt 157 lb (71.2 kg)   SpO2 99%   BMI 28.72 kg/m   Wt Readings from Last 3 Encounters:  09/15/20 157 lb (71.2 kg)  07/06/20  161 lb 3.2 oz (73.1 kg)  05/12/20 160 lb (72.6 kg)    Physical Exam Vitals reviewed.  Constitutional:      General: She is not in acute distress.    Appearance: Normal appearance. She is overweight. She is not ill-appearing, toxic-appearing or diaphoretic.  HENT:     Head: Normocephalic and atraumatic.  Eyes:     General: No scleral icterus.       Right eye: No discharge.        Left eye: No discharge.     Conjunctiva/sclera: Conjunctivae normal.  Cardiovascular:  Rate and Rhythm: Normal rate and regular rhythm.     Heart sounds: Normal heart sounds. No murmur heard. No friction rub. No gallop.   Pulmonary:     Effort: Pulmonary effort is normal. No respiratory distress.     Breath sounds: Normal breath sounds. No stridor. No wheezing, rhonchi or rales.  Musculoskeletal:        General: Normal range of motion.     Cervical back: Normal range of motion.  Skin:    General: Skin is warm and dry.     Capillary Refill: Capillary refill takes less than 2 seconds.  Neurological:     General: No focal deficit present.     Mental Status: She is alert and oriented to person, place, and time. Mental status is at baseline.  Psychiatric:        Mood and Affect: Mood normal.        Behavior: Behavior normal.        Thought Content: Thought content normal.        Judgment: Judgment normal.     No results found for: TSH Lab Results  Component Value Date   WBC 4.3 05/12/2020   HGB 14.0 05/12/2020   HCT 40.1 05/12/2020   MCV 96 05/12/2020   PLT 226 05/12/2020   Lab Results  Component Value Date   NA 141 05/12/2020   K 5.3 (H) 05/12/2020   CO2 26 05/12/2020   GLUCOSE 94 05/12/2020   BUN 10 05/12/2020   CREATININE 0.85 05/12/2020   BILITOT 0.6 05/12/2020   ALKPHOS 65 05/12/2020   AST 18 05/12/2020   ALT 14 05/12/2020   PROT 6.9 05/12/2020   ALBUMIN 4.1 05/12/2020   CALCIUM 9.5 05/12/2020   ANIONGAP 6 10/21/2018   Lab Results  Component Value Date   CHOL 142  05/12/2020   Lab Results  Component Value Date   HDL 59 05/12/2020   Lab Results  Component Value Date   LDLCALC 66 05/12/2020   Lab Results  Component Value Date   TRIG 92 05/12/2020   Lab Results  Component Value Date   CHOLHDL 2.4 05/12/2020   Lab Results  Component Value Date   HGBA1C 5.2 10/17/2018           

## 2020-09-16 LAB — BMP8+EGFR
BUN/Creatinine Ratio: 15 (ref 12–28)
BUN: 11 mg/dL (ref 8–27)
CO2: 22 mmol/L (ref 20–29)
Calcium: 8.8 mg/dL (ref 8.7–10.3)
Chloride: 104 mmol/L (ref 96–106)
Creatinine, Ser: 0.75 mg/dL (ref 0.57–1.00)
Glucose: 154 mg/dL — ABNORMAL HIGH (ref 65–99)
Potassium: 4.4 mmol/L (ref 3.5–5.2)
Sodium: 142 mmol/L (ref 134–144)
eGFR: 85 mL/min/{1.73_m2} (ref >59)

## 2020-09-21 DIAGNOSIS — R7309 Other abnormal glucose: Secondary | ICD-10-CM | POA: Diagnosis not present

## 2020-09-21 LAB — BAYER DCA HB A1C WAIVED: HB A1C (BAYER DCA - WAIVED): 5.6 % (ref <7.0)

## 2020-09-21 NOTE — Addendum Note (Signed)
Addended by: Michaela Corner on: 09/21/2020 03:14 PM   Modules accepted: Orders

## 2020-09-21 NOTE — Addendum Note (Signed)
Addended by: Collier Bullock on: 09/21/2020 03:27 PM   Modules accepted: Orders

## 2020-11-18 ENCOUNTER — Other Ambulatory Visit: Payer: Self-pay

## 2020-11-18 ENCOUNTER — Encounter (HOSPITAL_COMMUNITY)
Admission: RE | Admit: 2020-11-18 | Discharge: 2020-11-18 | Disposition: A | Discharge status: Home / Self Care | Payer: Medicare Other | Source: Ambulatory Visit | Attending: General Surgery | Admitting: General Surgery

## 2020-11-18 ENCOUNTER — Encounter (HOSPITAL_COMMUNITY): Payer: Self-pay

## 2020-11-18 DIAGNOSIS — Z20822 Contact with and (suspected) exposure to covid-19: Secondary | ICD-10-CM | POA: Diagnosis not present

## 2020-11-18 DIAGNOSIS — K449 Diaphragmatic hernia without obstruction or gangrene: Secondary | ICD-10-CM | POA: Diagnosis not present

## 2020-11-18 DIAGNOSIS — I69328 Other speech and language deficits following cerebral infarction: Secondary | ICD-10-CM | POA: Diagnosis not present

## 2020-11-18 DIAGNOSIS — R1314 Dysphagia, pharyngoesophageal phase: Secondary | ICD-10-CM | POA: Diagnosis not present

## 2020-11-18 DIAGNOSIS — K2100 Gastro-esophageal reflux disease with esophagitis, without bleeding: Secondary | ICD-10-CM | POA: Diagnosis present

## 2020-11-18 DIAGNOSIS — I69354 Hemiplegia and hemiparesis following cerebral infarction affecting left non-dominant side: Secondary | ICD-10-CM | POA: Diagnosis not present

## 2020-11-18 DIAGNOSIS — I10 Essential (primary) hypertension: Secondary | ICD-10-CM | POA: Diagnosis not present

## 2020-11-18 DIAGNOSIS — E559 Vitamin D deficiency, unspecified: Secondary | ICD-10-CM | POA: Diagnosis not present

## 2020-11-18 DIAGNOSIS — M19041 Primary osteoarthritis, right hand: Secondary | ICD-10-CM | POA: Diagnosis not present

## 2020-11-18 DIAGNOSIS — K224 Dyskinesia of esophagus: Secondary | ICD-10-CM | POA: Diagnosis not present

## 2020-11-18 DIAGNOSIS — Z9889 Other specified postprocedural states: Secondary | ICD-10-CM | POA: Diagnosis not present

## 2020-11-18 DIAGNOSIS — Z7982 Long term (current) use of aspirin: Secondary | ICD-10-CM | POA: Diagnosis not present

## 2020-11-18 DIAGNOSIS — E7800 Pure hypercholesterolemia, unspecified: Secondary | ICD-10-CM | POA: Diagnosis not present

## 2020-11-18 DIAGNOSIS — K222 Esophageal obstruction: Secondary | ICD-10-CM | POA: Diagnosis present

## 2020-11-18 DIAGNOSIS — I69391 Dysphagia following cerebral infarction: Secondary | ICD-10-CM

## 2020-11-18 DIAGNOSIS — M19042 Primary osteoarthritis, left hand: Secondary | ICD-10-CM | POA: Diagnosis present

## 2020-11-18 DIAGNOSIS — Z01812 Encounter for preprocedural laboratory examination: Secondary | ICD-10-CM | POA: Diagnosis not present

## 2020-11-18 LAB — BASIC METABOLIC PANEL
Anion gap: 7 (ref 5–15)
BUN: 13 mg/dL (ref 8–23)
CO2: 24 mmol/L (ref 22–32)
Calcium: 9 mg/dL (ref 8.9–10.3)
Chloride: 104 mmol/L (ref 98–111)
Creatinine, Ser: 0.85 mg/dL (ref 0.44–1.00)
GFR, Estimated: >60 mL/min (ref >60)
Glucose, Bld: 102 mg/dL — ABNORMAL HIGH (ref 70–99)
Potassium: 3.9 mmol/L (ref 3.5–5.1)
Sodium: 135 mmol/L (ref 135–145)

## 2020-11-18 LAB — CBC
HCT: 42 % (ref 36.0–46.0)
Hemoglobin: 13.8 g/dL (ref 12.0–15.0)
MCH: 32.3 pg (ref 26.0–34.0)
MCHC: 32.9 g/dL (ref 30.0–36.0)
MCV: 98.4 fL (ref 80.0–100.0)
Platelets: 224 10*3/uL (ref 150–400)
RBC: 4.27 MIL/uL (ref 3.87–5.11)
RDW: 12.4 % (ref 11.5–15.5)
WBC: 4.4 10*3/uL (ref 4.0–10.5)
nRBC: 0 % (ref 0.0–0.2)

## 2020-11-18 NOTE — Progress Notes (Signed)
PCP -Britney Joyce,FNP  Cardiologist - denies  PPM/ICD - denies Device Orders -  Rep Notified -   Chest x-ray -  EKG - 11/18/20 Stress Test - none ECHO - 10/17/18 Cardiac Cath - denies  Sleep Study - denies CPAP - no  Fasting Blood Sugar - n/a Checks Blood Sugar _____ times a day  Blood Thinner Instructions:n/a Aspirin Instructions:told by surgeon to stop 3-5 days prior to surgery   ERAS Protcol - yes PRE-SURGERY Ensure or G2- pt given water due to strawberry extract allergy.   COVID TEST- no-posted as ambulatory case   Anesthesia review: yes-history of anesthesia complications  Patient denies shortness of breath, fever, cough and chest pain at PAT appointment   All instructions explained to the patient, with a verbal understanding of the material. Patient agrees to go over the instructions while at home for a better understanding. Patient also instructed to self quarantine after being tested for COVID-19. The opportunity to ask questions was provided.

## 2020-11-18 NOTE — Progress Notes (Signed)
Surgical Instructions    Your procedure is scheduled on 11/24/2020.  Report to [facility] Main Entrance "A" at 0945, then check in with the Admitting office.  Call this number if you have problems the morning of surgery:  [phone]   If you have any questions prior to your surgery date call [phone]: Open Monday-Friday 8am-4pm    Remember:  Do not eat after midnight the night before your surgery  You may drink clear liquids until 8:45am the morning of your surgery.   Clear liquids allowed are: Water, Non-Citrus Juices (without pulp), Carbonated Beverages, Clear Tea, Black Coffee Only, and Gatorade    Take these medicines the morning of surgery with A SIP OF WATER :            acetaminophen (TYLENOL) if needed            atorvastatin (LIPITOR)            famotidine (PEPCID) if needed   As of today, STOP taking any Aspirin (unless otherwise instructed by your surgeon) Aleve, Naproxen, Ibuprofen, Motrin, Advil, Goody's, BC's, all herbal medications, fish oil, and all vitamins.          Do not wear jewelry or makeup Do not wear lotions, powders, perfumes/colognes, or deodorant. Do not shave 48 hours prior to surgery.  Men may shave face and neck. Do not bring valuables to the hospital. DO Not wear nail polish, gel polish, artificial nails, or any other type of covering on  natural nails including finger and toenails. If patients have artificial nails, gel coating, etc. that need to be removed by a nail salon please have this removed prior to surgery or surgery may need to be canceled/delayed if the surgeon/ anesthesia feels like the patient is unable to be adequately monitored.             [facility] is not responsible for any belongings or valuables.  Do NOT Smoke (Tobacco/Vaping) or drink Alcohol 24 hours prior to your procedure If you use a CPAP at night, you may bring all equipment for your overnight stay.   Contacts, glasses, dentures or bridgework may not be worn into  surgery, please bring cases for these belongings   For patients admitted to the hospital, discharge time will be determined by your treatment team.   Patients discharged the day of surgery will not be allowed to drive home, and someone needs to stay with them for 24 hours.  ONLY 1 SUPPORT PERSON MAY BE PRESENT WHILE YOU ARE IN SURGERY. IF YOU ARE TO BE ADMITTED ONCE YOU ARE IN YOUR ROOM YOU WILL BE ALLOWED TWO (2) VISITORS.  Minor children may have two parents present. Special consideration for safety and communication needs will be reviewed on a case by case basis.  Special instructions:    Oral Hygiene is also important to reduce your risk of infection.  Remember - BRUSH YOUR TEETH THE MORNING OF SURGERY WITH YOUR REGULAR TOOTHPASTE   [facility]- Preparing For Surgery  Before surgery, you can play an important role. Because skin is not sterile, your skin needs to be as free of germs as possible. You can reduce the number of germs on your skin by washing with CHG (chlorahexidine gluconate) Soap before surgery.  CHG is an antiseptic cleaner which kills germs and bonds with the skin to continue killing germs even after washing.     Please do not use if you have an allergy to CHG or antibacterial soaps.  If your skin becomes reddened/irritated stop using the CHG.  Do not shave (including legs and underarms) for at least 48 hours prior to first CHG shower. It is OK to shave your face.  Please follow these instructions carefully.     Shower the NIGHT BEFORE SURGERY and the MORNING OF SURGERY with CHG Soap.   If you chose to wash your hair, wash your hair first as usual with your normal shampoo. After you shampoo, rinse your hair and body thoroughly to remove the shampoo.  Then [employer] and genitals (private parts) with your normal soap and rinse thoroughly to remove soap.  After that Use CHG Soap as you would any other liquid soap. You can apply CHG directly to the skin and wash gently with a  scrungie or a clean washcloth.   Apply the CHG Soap to your body ONLY FROM THE NECK DOWN.  Do not use on open wounds or open sores. Avoid contact with your eyes, ears, mouth and genitals (private parts). Wash Face and genitals (private parts)  with your normal soap.   Wash thoroughly, paying special attention to the area where your surgery will be performed.  Thoroughly rinse your body with warm water from the neck down.  DO NOT shower/wash with your normal soap after using and rinsing off the CHG Soap.  Pat yourself dry with a CLEAN TOWEL.  Wear CLEAN PAJAMAS to bed the night before surgery  Place CLEAN SHEETS on your bed the night before your surgery  DO NOT SLEEP WITH PETS.   Day of Surgery:  Take a shower with CHG soap. Wear Clean/Comfortable clothing the morning of surgery Do not apply any deodorants/lotions.   Remember to brush your teeth WITH YOUR REGULAR TOOTHPASTE.   Please read over the following fact sheets that you were given.

## 2020-11-18 NOTE — Progress Notes (Signed)
Surgical Instructions    Your procedure is scheduled on 11/24/2020.  Report to [facility] Entrance "A" at 12 PM., then check in with the Admitting office.  Call this number if you have problems the morning of surgery:  [phone]   If you have any questions prior to your surgery date call [phone]: Open Monday-Friday 8am-4pm    Remember:  Do not eat after midnight the night before your surgery  You may drink clear liquids until 9am the morning of your surgery.   Clear liquids allowed are: Water, Non-Citrus Juices (without pulp), Carbonated Beverages, Clear Tea, Black Coffee Only, and Gatorade    Take these medicines the morning of surgery with A SIP OF WATER :            acetaminophen (TYLENOL) if needed            atorvastatin (LIPITOR)            famotidine (PEPCID) if needed   As of today, STOP taking any Aspirin (unless otherwise instructed by your surgeon) Aleve, Naproxen, Ibuprofen, Motrin, Advil, Goody's, BC's, all herbal medications, fish oil, and all vitamins.          Do not wear jewelry or makeup Do not wear lotions, powders, perfumes/colognes, or deodorant. Do not shave 48 hours prior to surgery.  Men may shave face and neck. Do not bring valuables to the hospital. DO Not wear nail polish, gel polish, artificial nails, or any other type of covering on  natural nails including finger and toenails. If patients have artificial nails, gel coating, etc. that need to be removed by a nail salon please have this removed prior to surgery or surgery may need to be canceled/delayed if the surgeon/ anesthesia feels like the patient is unable to be adequately monitored.             [facility] is not responsible for any belongings or valuables.  Do NOT Smoke (Tobacco/Vaping) or drink Alcohol 24 hours prior to your procedure If you use a CPAP at night, you may bring all equipment for your overnight stay.   Contacts, glasses, dentures or bridgework may not be worn into  surgery, please bring cases for these belongings   For patients admitted to the hospital, discharge time will be determined by your treatment team.   Patients discharged the day of surgery will not be allowed to drive home, and someone needs to stay with them for 24 hours.  ONLY 1 SUPPORT PERSON MAY BE PRESENT WHILE YOU ARE IN SURGERY. IF YOU ARE TO BE ADMITTED ONCE YOU ARE IN YOUR ROOM YOU WILL BE ALLOWED TWO (2) VISITORS.  Minor children may have two parents present. Special consideration for safety and communication needs will be reviewed on a case by case basis.  Special instructions:    Oral Hygiene is also important to reduce your risk of infection.  Remember - BRUSH YOUR TEETH THE MORNING OF SURGERY WITH YOUR REGULAR TOOTHPASTE   [place]- Preparing For Surgery  Before surgery, you can play an important role. Because skin is not sterile, your skin needs to be as free of germs as possible. You can reduce the number of germs on your skin by washing with CHG (chlorahexidine gluconate) Soap before surgery.  CHG is an antiseptic cleaner which kills germs and bonds with the skin to continue killing germs even after washing.     Please do not use if you have an allergy to CHG or antibacterial  soaps. If your skin becomes reddened/irritated stop using the CHG.  Do not shave (including legs and underarms) for at least 48 hours prior to first CHG shower. It is OK to shave your face.  Please follow these instructions carefully.     Shower the NIGHT BEFORE SURGERY and the MORNING OF SURGERY with CHG Soap.   If you chose to wash your hair, wash your hair first as usual with your normal shampoo. After you shampoo, rinse your hair and body thoroughly to remove the shampoo.  Then [employer] and genitals (private parts) with your normal soap and rinse thoroughly to remove soap.  After that Use CHG Soap as you would any other liquid soap. You can apply CHG directly to the skin and wash gently with a  scrungie or a clean washcloth.   Apply the CHG Soap to your body ONLY FROM THE NECK DOWN.  Do not use on open wounds or open sores. Avoid contact with your eyes, ears, mouth and genitals (private parts). Wash Face and genitals (private parts)  with your normal soap.   Wash thoroughly, paying special attention to the area where your surgery will be performed.  Thoroughly rinse your body with warm water from the neck down.  DO NOT shower/wash with your normal soap after using and rinsing off the CHG Soap.  Pat yourself dry with a CLEAN TOWEL.  Wear CLEAN PAJAMAS to bed the night before surgery  Place CLEAN SHEETS on your bed the night before your surgery  DO NOT SLEEP WITH PETS.   Day of Surgery:  Take a shower with CHG soap. Wear Clean/Comfortable clothing the morning of surgery Do not apply any deodorants/lotions.   Remember to brush your teeth WITH YOUR REGULAR TOOTHPASTE.   Please read over the following fact sheets that you were given.

## 2020-11-19 NOTE — Anesthesia Preprocedure Evaluation (Addendum)
Anesthesia Evaluation  Patient identified by MRN, date of birth, ID band Patient awake    Reviewed: Allergy & Precautions, NPO status , Patient's Chart, lab work & pertinent test results  History of Anesthesia Complications (+) Family history of anesthesia reaction and history of anesthetic complications  Airway Mallampati: II  TM Distance: >3 FB Neck ROM: Full    Dental  (+) Dental Advisory Given, Teeth Intact   Pulmonary neg pulmonary ROS,    Pulmonary exam normal breath sounds clear to auscultation       Cardiovascular hypertension, Pt. on medications Normal cardiovascular exam Rhythm:Regular Rate:Normal     Neuro/Psych  Headaches, PSYCHIATRIC DISORDERS Anxiety CVA    GI/Hepatic Neg liver ROS, hiatal hernia, GERD  ,  Endo/Other  negative endocrine ROS  Renal/GU negative Renal ROS     Musculoskeletal  (+) Arthritis ,   Abdominal   Peds  Hematology negative hematology ROS (+)   Anesthesia Other Findings   Reproductive/Obstetrics                                                            Anesthesia Evaluation  Patient identified by MRN, date of birth, ID band Patient awake    Reviewed: Allergy & Precautions, H&P , NPO status , Patient's Chart, lab work & pertinent test results  Airway Mallampati: II   Neck ROM: full    Dental   Pulmonary    breath sounds clear to auscultation       Cardiovascular hypertension,  Rhythm:regular Rate:Normal     Neuro/Psych  Headaches, PSYCHIATRIC DISORDERS Anxiety CVA    GI/Hepatic hiatal hernia, GERD  ,  Endo/Other    Renal/GU      Musculoskeletal  (+) Arthritis ,   Abdominal   Peds  Hematology   Anesthesia Other Findings   Reproductive/Obstetrics                             Anesthesia Physical Anesthesia Plan  ASA: III  Anesthesia Plan: MAC   Post-op Pain Management:    Induction:  Intravenous  PONV Risk Score and Plan: 2 and Propofol infusion and Treatment may vary due to age or medical condition  Airway Management Planned: Nasal Cannula  Additional Equipment:   Intra-op Plan:   Post-operative Plan:   Informed Consent: I have reviewed the patients History and Physical, chart, labs and discussed the procedure including the risks, benefits and alternatives for the proposed anesthesia with the patient or authorized representative who has indicated his/her understanding and acceptance.       Plan Discussed with: CRNA, Anesthesiologist and Surgeon  Anesthesia Plan Comments:         Anesthesia Quick Evaluation  Anesthesia Physical Anesthesia Plan  ASA: 2  Anesthesia Plan: General   Post-op Pain Management:    Induction: Intravenous  PONV Risk Score and Plan: 4 or greater and Ondansetron, Dexamethasone, Treatment may vary due to age or medical condition and Midazolam  Airway Management Planned: Oral ETT  Additional Equipment:   Intra-op Plan:   Post-operative Plan: Extubation in OR  Informed Consent: I have reviewed the patients History and Physical, chart, labs and discussed the procedure including the risks, benefits and alternatives for the proposed anesthesia with the patient or authorized  representative who has indicated his/her understanding and acceptance.     Dental advisory given  Plan Discussed with: CRNA  Anesthesia Plan Comments: (2 x PIV   PAT note written 11/19/2020 by Myra Gianotti, PA-C. )       Anesthesia Quick Evaluation

## 2020-11-19 NOTE — Progress Notes (Signed)
Anesthesia Chart Review:  Case: [id number] Date/Time: 11/24/20 1130   Procedure: XI ROBOTIC ASSISTED HIATAL HERNIA REPAIR WITH MESH AND FUNDOPLICATION   Anesthesia type: General   Pre-op diagnosis: RECURRENT HIATAL HERNIA   Location: [facility] OR ROOM 10 / [facility]   Surgeons: Ralene Ok, MD       DISCUSSION: Patient is a 75 year old female scheduled for the above procedure.  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 Reported she and female family members are slow to wake up after anesthesia.   Neurology input outlined by Dr. Leonie Man on 07/06/20 and includes, ".Marland KitchenMarland KitchenShe is neurologically cleared for hernia surgery with a small but acceptable periprocedural risk of a stroke when she holds aspirin if acceptable to hjer. Followup in the future with me in  Only as needed." Plavix was discontinued. She reported instructions from surgeon to hold ASA 3-5 days prior to OR.  Preoperative COVID-19 test is scheduled for 11/20/20. Anesthesia team to evaluate on the day of surgery.   VS: BP 126/66   Pulse 64   Temp 36.7 C (Oral)   Resp 17   Ht 5\' 2"  (1.575 m)   Wt 69.7 kg   SpO2 99%   BMI 28.11 kg/m   PROVIDERS: Loman Brooklyn, FNP is PCP. Last visit 09/15/20 and was aware of surgery plans. Antony Contras, MD is neurologist  LABS: Most recent lab results include: Lab Results  Component Value Date   WBC 4.4 11/18/2020   HGB 13.8 11/18/2020   HCT 42.0 11/18/2020   PLT 224 11/18/2020   GLUCOSE 102 (H) 11/18/2020   ALT 14 05/12/2020   AST 18 05/12/2020   NA 135 11/18/2020   K 3.9 11/18/2020   CL 104 11/18/2020   CREATININE 0.85 11/18/2020   BUN 13 11/18/2020   CO2 24 11/18/2020   HGBA1C 5.6 09/21/2020     IMAGES: CXD 05/12/20: FINDINGS: Left lung apex excluded from the field of view. No focal airspace disease. Normal cardiomediastinal contours and  pleural spaces. IMPRESSION: No active cardiopulmonary disease.   CTA Head/neck 10/16/18: IMPRESSION: No intracranial or extracranial stenosis of significance.    EKG: 11/18/20: NSR   CV: Echo 10/17/18: IMPRESSIONS   1. The left ventricle has normal systolic function with an ejection  fraction of 60-65%. The cavity size was normal. There is mildly increased  left ventricular wall thickness. Left ventricular diastolic Doppler  parameters are consistent with impaired  relaxation. Indeterminate filling pressures.   2. The right ventricle has normal systolic function. The cavity was  normal. There is no increase in right ventricular wall thickness.   3. The aortic valve is tricuspid. Aortic valve regurgitation was not  assessed by color flow Doppler. No stenosis of the aortic valve.   Past Medical History:  Diagnosis Date   Anxiety    Arthritis    Cataracts, bilateral    Corrected with surgery   Complication of anesthesia    slow to awaken 6 yrs ago after ruptured hiatal hernia surgery   Family history of adverse reaction to anesthesia    women in family hard to awaken   GERD (gastroesophageal reflux disease)    H/O hiatal hernia    Headache(784.0)    Hyperlipidemia    Hypertension    Stroke ([place]) 09/2018 and old cva not sure when   09-2018 took tpa for, no residual defecits on plavix  Past Surgical History:  Procedure Laterality Date   BIOPSY  02/21/2019   Procedure: BIOPSY;  Surgeon: Alphonsa Overall, MD;  Location: [facility];  Service: General;;   CESAREAN SECTION     COLONOSCOPY  09/16/2011   Procedure: COLONOSCOPY;  Surgeon: Beryle Beams, MD;  Location: [facility];  Service: Endoscopy;  Laterality: N/A;   ESOPHAGOGASTRODUODENOSCOPY  09/16/2011   Procedure: ESOPHAGOGASTRODUODENOSCOPY (EGD);  Surgeon: Beryle Beams, MD;  Location: [facility] ENDOSCOPY;  Service: Endoscopy;  Laterality: N/A;  Per Dr. Benson Norway, pt wants do procedures at [facility] instead of [facility]    ESOPHAGOGASTRODUODENOSCOPY N/A 02/21/2019   Procedure: upper endoscopy with possible dilation;  Surgeon: Alphonsa Overall, MD;  Location: [facility] ENDOSCOPY;  Service: General;  Laterality: N/A;   EYE SURGERY     cataracts both eyes   LAPAROSCOPIC NISSEN FUNDOPLICATION N/A 3/75/4360   Procedure: LAPAROSCOPIC NISSEN FUNDOPLICATION laparoscopic hiatal hernia repair  ;  Surgeon: Shann Medal, MD;  Location: [facility] ORS;  Service: General;  Laterality: N/A;   TONSILLECTOMY  age 17    MEDICATIONS:  acetaminophen (TYLENOL) 500 MG tablet   aspirin EC 81 MG tablet   atorvastatin (LIPITOR) 20 MG tablet   docusate sodium (COLACE) 100 MG capsule   famotidine (PEPCID) 10 MG tablet   lisinopril (ZESTRIL) 10 MG tablet   meclizine (ANTIVERT) 12.5 MG tablet   melatonin 5 MG TABS   senna (SENOKOT) 8.6 MG tablet   Vitamin D, Ergocalciferol, (DRISDOL) 1.25 MG (50000 UNIT) CAPS capsule   No current facility-administered medications for this encounter.    Myra Gianotti, PA-C Surgical Short Stay/Anesthesiology [facility] Phone [phone] [facility] Phone [phone] 11/19/2020 2:56 PM

## 2020-11-20 ENCOUNTER — Other Ambulatory Visit (HOSPITAL_COMMUNITY)
Admission: RE | Admit: 2020-11-20 | Discharge: 2020-11-20 | Disposition: A | Discharge status: Home / Self Care | Payer: Medicare Other | Source: Ambulatory Visit | Attending: General Surgery | Admitting: General Surgery

## 2020-11-20 DIAGNOSIS — Z01812 Encounter for preprocedural laboratory examination: Secondary | ICD-10-CM | POA: Diagnosis not present

## 2020-11-20 DIAGNOSIS — Z20822 Contact with and (suspected) exposure to covid-19: Secondary | ICD-10-CM | POA: Insufficient documentation

## 2020-11-20 LAB — SARS CORONAVIRUS 2 (TAT 6-24 HRS): SARS Coronavirus 2: NEGATIVE

## 2020-11-23 NOTE — H&P (Signed)
History of Present Illness  The patient is a 75 year old female who presents with a complaint of hernia. Patient follow back up today secondary to recurrent hiatal hernia. Patient continues with dysphagia.  She again says that she has difficulty with meats and breads. Patient recently saw Dr. Leonie Osborn her neurologist.  He has cleared her for surgery.  She is currently on aspirin and no longer on Plavix.     ---------------------------   Patient is a 75 year old female, who comes in after a one-year follow-up for a recurrent hiatal hernia.  The patient continues with dysphagia.  She states that these are mainly with meats and breads.  Patient has been managing her diet with foods that are easily go down.  Patient states that she's had no issues with recurrent strokes at this time.   I discussed with the patient and her son that secondary to her strokes and her being on Plavix if her dysphagia symptoms continued to increase she may require surgery in the near future.  Secondary to her strokes I would recommend neurology follow-up and clearance for surgery and/or future procedures needed prior to hiatal hernia repair.   ------------------------------- The PCP is [facility] (Brittany Osborn) She is by herself.     [The Covid-19 virus has disrupted normal medical care in [place] and across the nation.  We have sometimes had to alter normal surgical/medical care to limit this epidemic and we have explained these changes to the patient.]          Brittany Osborn is doing better since I last saw her.  It seems some of her issues are social as much as physical.       She had an argument with her daughter, has now moved in with her son, who is single.  She says that her son is much like her husband and they get along well.       She is still getting over her stroke.  Unfortunately, it looks like she's had no follow-up with Dr. Leonie Osborn.  I encouraged her to try to reestablish a follow-up with the  neurologist long term.       She thinks part of her GI symptoms was due to swelling.  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  This extract helped her anxiety and sleep.  Since she has stopped this, she is doing better.     She is still depressed over her husband's death last spring and still working through this.      She has gained 6 pounds since I last saw her.  And she says her swallowing is better.       I have given her Protonix for reflux/gastritis, which she is almost out of.  She asked if she still needed this.  I told her that she can try to control her symptoms with over-the-counter antiulcer medicine such as Pepcid.  If this does not work, we can refill the prescription.   Plan:  1.  Follow up in one year.         History of esophageal issues:      I took care of Brittany Osborn in 11/12/2012 for a large hiatal hernia.    The last time I saw her was 06/20/2013.      Brittany Osborn went through a fairly traumatic 10-17-18.  Her husband died in Oct 17, 2022 of vascular dementia.   Some time this spring, predating  her husband's death, she started having increasing dysphagia.  Particularly with breads or heavy food, she felt that certain foods would not get down.  Since her husband's death, she is estimated she vomits about once a week.       She's guessing that she has lost about 20 pounds.   Of note, when I did her surgery - she weighed 128 pounds.  She now weighs 151 pounds.       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  She said that she passed out going to the hospital.  A head CT scan on 17-Oct-2018 showed hypodensity in the right posterior frontal lobe.  She was hospitalized from 19 May - 18 Oct 2018 and treated with tPA.  She still has some left left weakness.  She walks with a cane.  She is right handed.  She was supposed to follow up with Dr. Leonie Osborn, but has not done that yet            She's here to see to  evaluate her dysphagia.        Her UGI on 12/11/2018 showed a 1. Distal esophageal stricture obstructs passage of a 13 mm barium tablet. Stricture has smooth but relatively abrupt shoulders and appearance favors benign etiology but upper endoscopy could be used to exclude neoplasm.          2. Nonspecific esophageal motility disorder.          3. Small hiatal hernia.     I did an upper endoscopy on 02/21/2019 - which showed Distal esophagitis, no esophageal stricture, 4 cm hiatal hernia, gastritis.  Her CLO test was positive.     I treated her H Pylori - but there is some question as to whether she took the meds correctly.   Review of Systems as stated in this history (HPI) or in the review of systems.  Otherwise all other 12 point ROS are negative   Past Medical History: 1.  She underwent a repair of a HH with Nissen fundoplication - 0/62/3762 - [facility]      She had a colonoscopy around the same time.      She commented that she did not want to see Dr. Benson Norway back again. 2. Arthritis - hands and legs 3. Anxiety 4. Bronchitis since she was a child      This resolved with her [facility] repair and has not come back since. 5.  Stroke - 2018-10-17      Treated with tPA      She was supposed to see Dr. Leonie Osborn back, but has not been able to follow up on that appt yet. 6.  On plavix        Social History: Widowed. Husband Brittany Osborn (DOB - 10/09/1939) - I did hernia surgery on him.       He died of vascular dementia in Oct 17, 2018.  She had a  stroke about the same time.  They had been married 36 years. She had an argument with her daughter - Brittany Osborn  Brittany Osborn is married) - so she moved out to live with her son.  Living with her son is doing much better. She has a son who is a Administrator.  He drives for [employer], he delivers cooking oil to restaurants.     Allergies ( No Known Drug Allergies   [06/06/2019]: No Known Allergies   [06/10/2020]:  Medication HistoryLisinopril  (10MG  Tablet, Oral)  Active. Vitamin D (Ergocalciferol)  (1.25 MG(50000 UT) Capsule, Oral) Active. Atorvastatin Calcium  (20MG  Tablet, Oral) Active. Medications Reconciled        Physical Exam  The physical exam findings are as follows: Note:  General: WN older WF who is alert and generally healthy appearing. She has a flat affect.  She is wearing a mask. HEENT: Normal. Pupils equal.   Neck: Supple. No mass.  No thyroid mass.   Lymph Nodes:  No supraclavicular or cervical nodes.   Lungs: Clear to auscultation and symmetric breath sounds. Heart:  RRR. No murmur or rub.   Abdomen: Soft. No mass. No tenderness. No hernia. Normal bowel sounds.        Her abdominal incisions all look good.   Extremities:  Good strength and ROM  in upper and lower extremities.       Assessment & Plan HISTORY OF NISSEN FUNDOPLICATION (R10.211) Story: HH repair and Nissen fundoplication - 1/73/5670 ESOPHAGEAL DYSPHAGIA (R13.19) Story: Upper endo - 02/21/2019 - [facility] - Distal esophagitis, no esophageal stricture, 4 cm hiatal hernia, gastritis Impression: Patient is a 75 year old female with a recurrent hiatal hernia, and dysphagia. 1. Will proceed to the operating room for a robotic hiatal hernia repair with mesh and fundoplication   2. Discussed with patient the risks and benefits of the procedure to include but not limited to: Infection, bleeding, damage to structures, possible pneumothorax, possible recurrence. The patient voiced understanding and wishes to proceed.

## 2020-11-24 ENCOUNTER — Inpatient Hospital Stay (HOSPITAL_COMMUNITY)
Admission: RE | Admit: 2020-11-24 | Discharge: 2020-11-25 | DRG: 327 | Disposition: A | Discharge status: Home / Self Care | Payer: Medicare Other | Attending: General Surgery | Admitting: General Surgery

## 2020-11-24 ENCOUNTER — Ambulatory Visit (HOSPITAL_COMMUNITY): Payer: Medicare Other | Admitting: Physician Assistant

## 2020-11-24 ENCOUNTER — Encounter (HOSPITAL_COMMUNITY)
Admission: RE | Disposition: A | Discharge status: Home / Self Care | Payer: Self-pay | Source: Home / Self Care | Attending: General Surgery

## 2020-11-24 ENCOUNTER — Encounter (HOSPITAL_COMMUNITY): Payer: Self-pay | Admitting: General Surgery

## 2020-11-24 ENCOUNTER — Ambulatory Visit (HOSPITAL_COMMUNITY): Payer: Medicare Other

## 2020-11-24 DIAGNOSIS — Z9889 Other specified postprocedural states: Secondary | ICD-10-CM | POA: Diagnosis present

## 2020-11-24 DIAGNOSIS — I69328 Other speech and language deficits following cerebral infarction: Secondary | ICD-10-CM | POA: Diagnosis not present

## 2020-11-24 DIAGNOSIS — Z7982 Long term (current) use of aspirin: Secondary | ICD-10-CM | POA: Diagnosis not present

## 2020-11-24 DIAGNOSIS — M19042 Primary osteoarthritis, left hand: Secondary | ICD-10-CM | POA: Diagnosis present

## 2020-11-24 DIAGNOSIS — E7800 Pure hypercholesterolemia, unspecified: Secondary | ICD-10-CM | POA: Diagnosis not present

## 2020-11-24 DIAGNOSIS — M19041 Primary osteoarthritis, right hand: Secondary | ICD-10-CM | POA: Diagnosis present

## 2020-11-24 DIAGNOSIS — K449 Diaphragmatic hernia without obstruction or gangrene: Secondary | ICD-10-CM | POA: Diagnosis not present

## 2020-11-24 DIAGNOSIS — K224 Dyskinesia of esophagus: Secondary | ICD-10-CM | POA: Diagnosis present

## 2020-11-24 DIAGNOSIS — E559 Vitamin D deficiency, unspecified: Secondary | ICD-10-CM | POA: Diagnosis not present

## 2020-11-24 DIAGNOSIS — K2100 Gastro-esophageal reflux disease with esophagitis, without bleeding: Secondary | ICD-10-CM | POA: Diagnosis present

## 2020-11-24 DIAGNOSIS — I69391 Dysphagia following cerebral infarction: Secondary | ICD-10-CM | POA: Diagnosis not present

## 2020-11-24 DIAGNOSIS — R1314 Dysphagia, pharyngoesophageal phase: Secondary | ICD-10-CM | POA: Diagnosis present

## 2020-11-24 DIAGNOSIS — I69354 Hemiplegia and hemiparesis following cerebral infarction affecting left non-dominant side: Secondary | ICD-10-CM | POA: Diagnosis not present

## 2020-11-24 DIAGNOSIS — K222 Esophageal obstruction: Secondary | ICD-10-CM | POA: Diagnosis present

## 2020-11-24 DIAGNOSIS — I10 Essential (primary) hypertension: Secondary | ICD-10-CM | POA: Diagnosis not present

## 2020-11-24 HISTORY — PX: INSERTION OF MESH: SHX5868

## 2020-11-24 HISTORY — PX: XI ROBOTIC ASSISTED HIATAL HERNIA REPAIR: SHX6889

## 2020-11-24 SURGERY — REPAIR, HERNIA, HIATAL, ROBOT-ASSISTED
Anesthesia: General | Site: Abdomen

## 2020-11-24 MED ORDER — DIPHENHYDRAMINE HCL 50 MG/ML IJ SOLN
INTRAMUSCULAR | Status: DC | PRN
  Administered 2020-11-24: 12.5 mg via INTRAVENOUS

## 2020-11-24 MED ORDER — DEXAMETHASONE SODIUM PHOSPHATE 10 MG/ML IJ SOLN
INTRAMUSCULAR | Status: AC
  Filled 2020-11-24: qty 1

## 2020-11-24 MED ORDER — ENOXAPARIN SODIUM 40 MG/0.4ML IJ SOSY
40.0000 mg | PREFILLED_SYRINGE | INTRAMUSCULAR | Status: DC
Start: 2020-11-25 — End: 2020-11-26
  Administered 2020-11-25: 40 mg via SUBCUTANEOUS
  Filled 2020-11-24: qty 0.4

## 2020-11-24 MED ORDER — ROCURONIUM BROMIDE 10 MG/ML (PF) SYRINGE
PREFILLED_SYRINGE | INTRAVENOUS | Status: DC | PRN
  Administered 2020-11-24: 40 mg via INTRAVENOUS
  Administered 2020-11-24: 20 mg via INTRAVENOUS

## 2020-11-24 MED ORDER — ONDANSETRON HCL 4 MG/2ML IJ SOLN
INTRAMUSCULAR | Status: DC | PRN
  Administered 2020-11-24: 4 mg via INTRAVENOUS

## 2020-11-24 MED ORDER — PROPOFOL 10 MG/ML IV BOLUS
INTRAVENOUS | Status: DC | PRN
  Administered 2020-11-24: 100 mg via INTRAVENOUS

## 2020-11-24 MED ORDER — BUPIVACAINE HCL (PF) 0.25 % IJ SOLN
INTRAMUSCULAR | Status: DC | PRN
  Administered 2020-11-24: 14 mL

## 2020-11-24 MED ORDER — HYDROMORPHONE HCL 1 MG/ML IJ SOLN
1.0000 mg | INTRAMUSCULAR | Status: DC | PRN
  Administered 2020-11-24 – 2020-11-25 (×5): 1 mg via INTRAVENOUS
  Filled 2020-11-24 (×5): qty 1

## 2020-11-24 MED ORDER — BUPIVACAINE HCL (PF) 0.25 % IJ SOLN
INTRAMUSCULAR | Status: AC
  Filled 2020-11-24: qty 30

## 2020-11-24 MED ORDER — KETOROLAC TROMETHAMINE 15 MG/ML IJ SOLN: 15.0000 mg | Freq: Four times a day (QID) | INTRAMUSCULAR | Status: DC | PRN

## 2020-11-24 MED ORDER — SODIUM CHLORIDE 0.9 % IV SOLN
INTRAVENOUS | Status: DC | PRN
  Administered 2020-11-24: 40 mL

## 2020-11-24 MED ORDER — PHENYLEPHRINE 40 MCG/ML (10ML) SYRINGE FOR IV PUSH (FOR BLOOD PRESSURE SUPPORT)
PREFILLED_SYRINGE | INTRAVENOUS | Status: DC | PRN
  Administered 2020-11-24: 80 ug via INTRAVENOUS

## 2020-11-24 MED ORDER — ENSURE PRE-SURGERY PO LIQD
296.0000 mL | Freq: Once | ORAL | Status: DC
Start: 2020-11-25 — End: 2020-11-24

## 2020-11-24 MED ORDER — ONDANSETRON HCL 4 MG/2ML IJ SOLN: 4.0000 mg | Freq: Four times a day (QID) | INTRAMUSCULAR | Status: DC | PRN

## 2020-11-24 MED ORDER — ACETAMINOPHEN 500 MG PO TABS
1000.0000 mg | ORAL_TABLET | ORAL | Status: AC
  Administered 2020-11-24: 1000 mg via ORAL
  Filled 2020-11-24: qty 2

## 2020-11-24 MED ORDER — HYDROMORPHONE HCL 1 MG/ML IJ SOLN
0.2500 mg | INTRAMUSCULAR | Status: DC | PRN
  Administered 2020-11-24 (×2): 0.5 mg via INTRAVENOUS

## 2020-11-24 MED ORDER — ORAL CARE MOUTH RINSE: 15.0000 mL | Freq: Once | OROMUCOSAL | Status: AC

## 2020-11-24 MED ORDER — PHENYLEPHRINE HCL-NACL 10-0.9 MG/250ML-% IV SOLN
INTRAVENOUS | Status: DC | PRN
  Administered 2020-11-24: 40 ug/min via INTRAVENOUS

## 2020-11-24 MED ORDER — LIDOCAINE 2% (20 MG/ML) 5 ML SYRINGE
INTRAMUSCULAR | Status: AC
  Filled 2020-11-24: qty 5

## 2020-11-24 MED ORDER — SUGAMMADEX SODIUM 200 MG/2ML IV SOLN
INTRAVENOUS | Status: DC | PRN
  Administered 2020-11-24: 200 mg via INTRAVENOUS

## 2020-11-24 MED ORDER — SUCCINYLCHOLINE CHLORIDE 200 MG/10ML IV SOSY
PREFILLED_SYRINGE | INTRAVENOUS | Status: DC | PRN
  Administered 2020-11-24: 100 mg via INTRAVENOUS

## 2020-11-24 MED ORDER — ONDANSETRON HCL 4 MG/2ML IJ SOLN
INTRAMUSCULAR | Status: AC
  Filled 2020-11-24: qty 2

## 2020-11-24 MED ORDER — FENTANYL CITRATE (PF) 250 MCG/5ML IJ SOLN
INTRAMUSCULAR | Status: DC | PRN
  Administered 2020-11-24: 100 ug via INTRAVENOUS
  Administered 2020-11-24: 50 ug via INTRAVENOUS

## 2020-11-24 MED ORDER — PANTOPRAZOLE SODIUM 40 MG IV SOLR
40.0000 mg | Freq: Every day | INTRAVENOUS | Status: DC
  Administered 2020-11-24: 40 mg via INTRAVENOUS
  Filled 2020-11-24: qty 40

## 2020-11-24 MED ORDER — HYDROMORPHONE HCL 1 MG/ML IJ SOLN: 1.0000 mg | INTRAMUSCULAR | Status: DC | PRN

## 2020-11-24 MED ORDER — LACTATED RINGERS IV SOLN: INTRAVENOUS | Status: DC

## 2020-11-24 MED ORDER — DEXAMETHASONE SODIUM PHOSPHATE 10 MG/ML IJ SOLN
INTRAMUSCULAR | Status: DC | PRN
  Administered 2020-11-24: 5 mg via INTRAVENOUS

## 2020-11-24 MED ORDER — ENOXAPARIN SODIUM 40 MG/0.4ML IJ SOSY: 40.0000 mg | PREFILLED_SYRINGE | INTRAMUSCULAR | Status: DC

## 2020-11-24 MED ORDER — PROMETHAZINE HCL 25 MG/ML IJ SOLN
6.2500 mg | INTRAMUSCULAR | Status: DC | PRN
Start: 2020-11-24 — End: 2020-11-24

## 2020-11-24 MED ORDER — HYDROMORPHONE HCL 1 MG/ML IJ SOLN
INTRAMUSCULAR | Status: AC
  Filled 2020-11-24: qty 1

## 2020-11-24 MED ORDER — DEXAMETHASONE SODIUM PHOSPHATE 10 MG/ML IJ SOLN: 4.0000 mg | INTRAMUSCULAR | Status: DC

## 2020-11-24 MED ORDER — CHLORHEXIDINE GLUCONATE CLOTH 2 % EX PADS: 6.0000 | MEDICATED_PAD | Freq: Once | CUTANEOUS | Status: DC

## 2020-11-24 MED ORDER — CEFAZOLIN SODIUM-DEXTROSE 2-4 GM/100ML-% IV SOLN
2.0000 g | INTRAVENOUS | Status: AC
  Administered 2020-11-24: 2 g via INTRAVENOUS
  Filled 2020-11-24: qty 100

## 2020-11-24 MED ORDER — CHLORHEXIDINE GLUCONATE 0.12 % MT SOLN
15.0000 mL | Freq: Once | OROMUCOSAL | Status: AC
  Administered 2020-11-24: 15 mL via OROMUCOSAL
  Filled 2020-11-24: qty 15

## 2020-11-24 MED ORDER — FENTANYL CITRATE (PF) 250 MCG/5ML IJ SOLN
INTRAMUSCULAR | Status: AC
  Filled 2020-11-24: qty 5

## 2020-11-24 MED ORDER — LACTATED RINGERS IV SOLN: INTRAVENOUS | Status: DC | PRN

## 2020-11-24 MED ORDER — ROCURONIUM BROMIDE 10 MG/ML (PF) SYRINGE
PREFILLED_SYRINGE | INTRAVENOUS | Status: AC
  Filled 2020-11-24: qty 10

## 2020-11-24 MED ORDER — POLYVINYL ALCOHOL 1.4 % OP SOLN
1.0000 [drp] | OPHTHALMIC | Status: DC | PRN
  Administered 2020-11-24 – 2020-11-25 (×2): 1 [drp] via OPHTHALMIC
  Filled 2020-11-24: qty 15

## 2020-11-24 MED ORDER — ONDANSETRON 4 MG PO TBDP: 4.0000 mg | ORAL_TABLET | Freq: Four times a day (QID) | ORAL | Status: DC | PRN

## 2020-11-24 MED ORDER — LIDOCAINE 2% (20 MG/ML) 5 ML SYRINGE
INTRAMUSCULAR | Status: DC | PRN
  Administered 2020-11-24: 60 mg via INTRAVENOUS

## 2020-11-24 MED ORDER — 0.9 % SODIUM CHLORIDE (POUR BTL) OPTIME
TOPICAL | Status: DC | PRN
  Administered 2020-11-24: 1000 mL

## 2020-11-24 MED ORDER — DIPHENHYDRAMINE HCL 50 MG/ML IJ SOLN
INTRAMUSCULAR | Status: AC
  Filled 2020-11-24: qty 1

## 2020-11-24 MED ORDER — BUPIVACAINE LIPOSOME 1.3 % IJ SUSP
INTRAMUSCULAR | Status: AC
  Filled 2020-11-24: qty 20

## 2020-11-24 MED ORDER — DEXTROSE-NACL 5-0.9 % IV SOLN: INTRAVENOUS | Status: DC

## 2020-11-24 MED ORDER — SUCCINYLCHOLINE CHLORIDE 20 MG/ML IJ SOLN: INTRAMUSCULAR | Status: DC | PRN

## 2020-11-24 SURGICAL SUPPLY — 64 items
APPLIER CLIP 5 13 M/L LIGAMAX5 (MISCELLANEOUS)
APR CLP MED LRG 5 ANG JAW (MISCELLANEOUS)
CANNULA REDUC XI 12-8 STAPL (CANNULA) ×2
CANNULA REDUC XI 12-8MM STAPL (CANNULA) ×1
CANNULA REDUCER 12-8 DVNC XI (CANNULA) ×1 IMPLANT
CHLORAPREP W/TINT 26 (MISCELLANEOUS) ×3 IMPLANT
CLIP APPLIE 5 13 M/L LIGAMAX5 (MISCELLANEOUS) IMPLANT
COVER MAYO STAND STRL (DRAPES) ×3 IMPLANT
COVER SURGICAL LIGHT HANDLE (MISCELLANEOUS) ×3 IMPLANT
COVER TIP SHEARS 8 DVNC (MISCELLANEOUS) IMPLANT
COVER TIP SHEARS 8MM DA VINCI (MISCELLANEOUS)
DECANTER SPIKE VIAL GLASS SM (MISCELLANEOUS) ×3 IMPLANT
DEFOGGER SCOPE WARMER CLEARIFY (MISCELLANEOUS) ×3 IMPLANT
DERMABOND ADVANCED (GAUZE/BANDAGES/DRESSINGS) ×2
DERMABOND ADVANCED .7 DNX12 (GAUZE/BANDAGES/DRESSINGS) ×1 IMPLANT
DEVICE TROCAR PUNCTURE CLOSURE (ENDOMECHANICALS) ×3 IMPLANT
DRAIN PENROSE 0.5X18 (DRAIN) IMPLANT
DRAPE ARM DVNC X/XI (DISPOSABLE) ×4 IMPLANT
DRAPE CARDIOVASC SPLIT 88X140 (DRAPES) ×3 IMPLANT
DRAPE COLUMN DVNC XI (DISPOSABLE) ×1 IMPLANT
DRAPE DA VINCI XI ARM (DISPOSABLE) ×12
DRAPE DA VINCI XI COLUMN (DISPOSABLE) ×3
DRAPE ORTHO SPLIT 77X108 STRL (DRAPES) ×3
DRAPE SURG ORHT 6 SPLT 77X108 (DRAPES) ×1 IMPLANT
ELECT REM PT RETURN 9FT ADLT (ELECTROSURGICAL) ×3
ELECTRODE REM PT RTRN 9FT ADLT (ELECTROSURGICAL) ×1 IMPLANT
GAUZE 4X4 16PLY ~~LOC~~+RFID DBL (SPONGE) ×3 IMPLANT
GLOVE SURG ENC MOIS LTX SZ7.5 (GLOVE) ×6 IMPLANT
GOWN STRL REUS W/ TWL LRG LVL3 (GOWN DISPOSABLE) ×1 IMPLANT
GOWN STRL REUS W/ TWL XL LVL3 (GOWN DISPOSABLE) ×2 IMPLANT
GOWN STRL REUS W/TWL 2XL LVL3 (GOWN DISPOSABLE) ×3 IMPLANT
GOWN STRL REUS W/TWL LRG LVL3 (GOWN DISPOSABLE) ×3
GOWN STRL REUS W/TWL XL LVL3 (GOWN DISPOSABLE) ×6
KIT BASIN OR (CUSTOM PROCEDURE TRAY) ×3 IMPLANT
KIT TURNOVER KIT B (KITS) IMPLANT
MARKER SKIN DUAL TIP RULER LAB (MISCELLANEOUS) ×3 IMPLANT
MESH BIO-A 7X10 SYN MAT (Mesh General) ×3 IMPLANT
NEEDLE 22X1 1/2 (OR ONLY) (NEEDLE) ×3 IMPLANT
NEEDLE INSUFFLATION 14GA 120MM (NEEDLE) ×3 IMPLANT
OBTURATOR OPTICAL STANDARD 8MM (TROCAR)
OBTURATOR OPTICAL STND 8 DVNC (TROCAR)
OBTURATOR OPTICALSTD 8 DVNC (TROCAR) IMPLANT
PENCIL SMOKE EVACUATOR (MISCELLANEOUS) IMPLANT
SCISSORS LAP 5X35 DISP (ENDOMECHANICALS) IMPLANT
SEAL CANN UNIV 5-8 DVNC XI (MISCELLANEOUS) ×3 IMPLANT
SEAL XI 5MM-8MM UNIVERSAL (MISCELLANEOUS) ×9
SEALER VESSEL DA VINCI XI (MISCELLANEOUS) ×3
SEALER VESSEL EXT DVNC XI (MISCELLANEOUS) ×1 IMPLANT
SET IRRIG TUBING LAPAROSCOPIC (IRRIGATION / IRRIGATOR) ×3 IMPLANT
SET TUBE SMOKE EVAC HIGH FLOW (TUBING) ×3 IMPLANT
SPONGE T-LAP 18X18 ~~LOC~~+RFID (SPONGE) ×3 IMPLANT
STAPLER CANNULA SEAL DVNC XI (STAPLE) ×1 IMPLANT
STAPLER CANNULA SEAL XI (STAPLE) ×3
STAPLER VISISTAT 35W (STAPLE) IMPLANT
STOPCOCK 4 WAY LG BORE MALE ST (IV SETS) ×3 IMPLANT
SUT ETHIBOND 0 36 GRN (SUTURE) ×6 IMPLANT
SUT ETHIBOND 2 0 SH (SUTURE)
SUT ETHIBOND 2 0 SH 36X2 (SUTURE) IMPLANT
SUT MNCRL AB 4-0 PS2 18 (SUTURE) ×3 IMPLANT
SUT SILK 0 SH 30 (SUTURE) ×9 IMPLANT
SYR 30ML SLIP (SYRINGE) ×3 IMPLANT
TRAY FOLEY MTR SLVR 16FR STAT (SET/KITS/TRAYS/PACK) ×3 IMPLANT
TRAY LAPAROSCOPIC MC (CUSTOM PROCEDURE TRAY) ×3 IMPLANT
TROCAR ADV FIXATION 5X100MM (TROCAR) ×3 IMPLANT

## 2020-11-24 NOTE — Transfer of Care (Signed)
Immediate Anesthesia Transfer of Care Note  Patient: Brittany Osborn  Procedure(s) Performed: XI ROBOTIC ASSISTED HIATAL HERNIA REPAIR WITH MESH AND FUNDOPLICATION (Abdomen) INSERTION OF MESH (Abdomen)  Patient Location: PACU  Anesthesia Type:General  Level of Consciousness: awake and drowsy  Airway & Oxygen Therapy: Patient Spontanous Breathing and Patient connected to face mask oxygen  Post-op Assessment: Report given to RN and Post -op Vital signs reviewed and stable  Post vital signs: Reviewed and stable  Last Vitals:  Vitals Value Taken Time  BP 137/61 11/24/20 1424  Temp    Pulse 72 11/24/20 1424  Resp 20 11/24/20 1424  SpO2 100 % 11/24/20 1424  Vitals shown include unvalidated device data.  Last Pain:  Vitals:   11/24/20 1020  TempSrc:   PainSc: 0-No pain      Patients Stated Pain Goal: 0 (09/81/19 1478)  Complications: No notable events documented.

## 2020-11-24 NOTE — Op Note (Signed)
11/24/2020  2:14 PM  PATIENT:  Brittany Osborn  75 y.o. female  PRE-OPERATIVE DIAGNOSIS:  RECURRENT HIATAL HERNIA  POST-OPERATIVE DIAGNOSIS:  RECURRENT HIATAL HERNIA  PROCEDURE:  Procedure(s): XI ROBOTIC ASSISTED HIATAL HERNIA REPAIR WITH MESH AND TOUPET FUNDOPLICATION (N/A) INSERTION OF MESH (N/A)  SURGEON:  Surgeon(s) and Role:    * Ralene Ok, MD - Primary   ANESTHESIA:   local and general  EBL:  10 mL   BLOOD ADMINISTERED:none  DRAINS: none   LOCAL MEDICATIONS USED:  BUPIVICAINE  and OTHER EXPAREL  SPECIMEN:  No Specimen  DISPOSITION OF SPECIMEN:  N/A  COUNTS:  YES  TOURNIQUET:  * No tourniquets in log *  DICTATION: .Dragon Dictation  The patient was taken back to the operating room and placed in the supine position with bilateral SCDs in place. The patient was prepped and draped in the usual sterile fashion. After appropriate antibiotics were confirmed a timeout was called and all facts were verified.   A Veress needle technique was used to insufflate the abdomen to 15 mm of mercury the paramedian stab incision. Subsequent to this an 8 mm trocar was introduced as was a 8 millimeter camera. At this time the subsequent robotic trochars x2, and a left midclavicular paramedian 12 mm trocar.  These were then placed adjacent to this trocar approximately 8-10 cm away. Each trocar was inserted under direct visualization, there were total of 4 trochars. The assistant trocar was then placed in the right lower quadrant under direct visualization. The Nathanson retractor was then visualized inserted into the abdomen and the incision just to the left of the falciform ligament. This was then placed to retract the liver appropriately. At this time the patient was positioned in reverse Trendelenburg.   At this time the robot patient cart was brought to the bedside and placed in good position and the arms were docked to the trochars appropriately.  At this time I was able to visualize  the hiatal hernia.  This appeared to be somewhat of an eventration of the hiatus.  At this time I proceeded to incised the gastrohepatic ligament.  At this time I proceeded to mobilize the stomach inferiorly and visualize the right crus.  There was some scar tissue in this area.  This was taken down sharply and with cautery to maintain hemostasis.  The peritoneum over the right crus was incised and right crus was identified.  At this time I proceeded to circumferentially dissect the previous wrap and esophagus from the hiatus.  Again there was what appeared to be an infiltration and the wrap was in the chest cavity.  There was dilation of the hiatus.  At this time I was able to bring this down with some retract into the abdominal cavity.  The wrap was visualized.  There were previous Ethibonds and tie knots that were found.  These were incised.  This allowed me to unwrap the fundoplication that was previously placed.  At this time I was able to retract the esophagus inferiorly.  I was able to dissect the esophagus around the mediastinum circumferentially.  This was done with the vessel sealer.  I continued dissection proximally in the mediastinum approximately 4 to 5 cm.  The left and right crus were visualized protected.    At this time we turned our attention to the greater curvature the stomach and the omentum was mobilized using the robotic vessel sealer. This was taken up to the greater curvature to the hiatus. This mobilized  the entire greater curvature to allow mobilization and the wrap. I then proceeded to bring the greater curvature the stomach posterior to the esophagus, and a shoeshine technique was used to evaluate the mobilization of the greater curvature.   At this time I proceeded to close the hiatus using interrupted 0 Ethibonds x 3. This brought together the hiatal closure without undue stricture to the esophagus.   A piece of Gore Bio A hiatal mesh was placed over the hiatal closure and  sutured to the crus using 0 Ethibond sutures x 4.  At this time the greater curvature was brought around the esophagus and sutured using 0 silk sutures interrupted fashion approximately 1 cm apart x3 on each side of the esophagus in a Toupet fashion. A left collar stitch was then used to gastropexy the stomach from the wrap to the diaphragm just lateral to the left crus as.  A second collar stitch was placed from the wrap to the right crus. The wrap lay at approximately 11:00 on its own with undue tension.  The wrap lay loose with no strangulation of the esophagus.  At this time the robot was undocked. The liver trocar was removed.  The 0 Vicryl was used to reapproximate the 12 mm trocar site.  This was done via an Endo Close x1.  At this time insufflation was evacuated. Skin was reapproximated for Monocryl subcuticular fashion. The skin was then dressed with Dermabond. The patient tolerated the procedure well and was taken to the recovery room in stable condition.   PLAN OF CARE: Admit for overnight observation  PATIENT DISPOSITION:  PACU - hemodynamically stable.   Delay start of Pharmacological VTE agent (>24hrs) due to surgical blood loss or risk of bleeding: not applicable

## 2020-11-24 NOTE — Interval H&P Note (Signed)
History and Physical Interval Note:  11/24/2020 11:26 AM  Brittany Osborn  has presented today for surgery, with the diagnosis of RECURRENT HIATAL HERNIA.  The various methods of treatment have been discussed with the patient and family. After consideration of risks, benefits and other options for treatment, the patient has consented to  Procedure(s): XI ROBOTIC ASSISTED HIATAL HERNIA REPAIR WITH MESH AND FUNDOPLICATION (N/A) as a surgical intervention.  [name] history has been reviewed, patient examined, no change in status, stable for surgery.  I have reviewed the patient's chart and labs.  Questions were answered to the patient's satisfaction.     Ralene Ok

## 2020-11-24 NOTE — Discharge Instructions (Signed)
EATING AFTER YOUR ESOPHAGEAL SURGERY (Stomach Fundoplication, Hiatal Hernia repair, Achalasia surgery, etc)  ######################################################################  EAT Start with a pureed / full liquid diet (see below) Gradually transition to a high fiber diet with a fiber supplement over the next month after discharge.    WALK Walk an hour a day.  Control your pain to do that.    CONTROL PAIN Control pain so that you can walk, sleep, tolerate sneezing/coughing, go up/down stairs.  HAVE A BOWEL MOVEMENT DAILY Keep your bowels regular to avoid problems.  OK to try a laxative to override constipation.  OK to use an antidairrheal to slow down diarrhea.  Call if not better after 2 tries  CALL IF YOU HAVE PROBLEMS/CONCERNS Call if you are still struggling despite following these instructions. Call if you have concerns not answered by these instructions  ######################################################################   After your esophageal surgery, expect some sticking with swallowing over the next 1-2 months.    If food sticks when you eat, it is called "dysphagia".  This is due to swelling around your esophagus at the wrap & hiatal diaphragm repair.  It will gradually ease off over the next few months.  To help you through this temporary phase, we start you out on a pureed (blenderized) diet.  Your first meal in the hospital was thin liquids.  You should have been given a pureed diet by the time you left the hospital.  We ask patients to stay on a pureed diet for the first 2-3 weeks to avoid anything getting "stuck" near your recent surgery.  Don't be alarmed if your ability to swallow doesn't progress according to this plan.  Everyone is different and some diets can advance more or less quickly.    It is often helpful to crush your medications or split them as they can sometimes stick, especially the first week or so.   Some BASIC RULES to follow  are:  Maintain an upright position whenever eating or drinking.  Take small bites - just a teaspoon size bite at a time.  Eat slowly.  It may also help to eat only one food at a time.  Consider nibbling through smaller, more frequent meals & avoid the urge to eat BIG meals  Do not push through feelings of fullness, nausea, or bloatedness  Do not mix solid foods and liquids in the same mouthful  Try not to "wash foods down" with large gulps of liquids.  Avoid carbonated (bubbly/fizzy) drinks.    Avoid foods that make you feel gassy or bloated.  Start with bland foods first.  Wait on trying greasy, fried, or spicy meals until you are tolerating more bland solids well.  Understand that it will be hard to burp and belch at first.  This gradually improves with time.  Expect to be more gassy/flatulent/bloated initially.  Walking will help your body manage it better.  Consider using medications for bloating that contain simethicone such as  Maalox or Gas-X   Consider crushing her medications, especially smaller pills.  The ability to swallow pills should get easier after a few weeks  Eat in a relaxed atmosphere & minimize distractions.  Avoid talking while eating.    Do not use straws.  Following each meal, sit in an upright position (90 degree angle) for 60 to 90 minutes.  Going for a short walk can help as well  If food does stick, don't panic.  Try to relax and let the food pass on its own.    Sipping WARM LIQUID such as strong hot black tea can also help slide it down.   Be gradual in changes & use common sense:  -If you easily tolerating a certain "level" of foods, advance to the next level gradually -If you are having trouble swallowing a particular food, then avoid it.   -If food is sticking when you advance your diet, go back to thinner previous diet (the lower LEVEL) for 1-2 days.  LEVEL 1 = PUREED DIET  Do for the first 2 WEEKS AFTER SURGERY  -Foods in this group are  pureed or blenderized to a smooth, mashed potato-like consistency.  -If necessary, the pureed foods can keep their shape with the addition of a thickening agent.   -Meat should be pureed to a smooth, pasty consistency.  Hot broth or gravy may be added to the pureed meat, approximately 1 oz. of liquid per 3 oz. serving of meat. -CAUTION:  If any foods do not puree into a smooth consistency, swallowing will be more difficult.  (For example, nuts or seeds sometimes do not blend well.)  Hot Foods Cold Foods  Pureed scrambled eggs and cheese Pureed cottage cheese  Baby cereals Thickened juices and nectars  Thinned cooked cereals (no lumps) Thickened milk or eggnog  Pureed French toast or pancakes Ensure  Mashed potatoes Ice cream  Pureed parsley, au gratin, scalloped potatoes, candied sweet potatoes Fruit or Italian ice, sherbet  Pureed buttered or alfredo noodles Plain yogurt  Pureed vegetables (no corn or peas) Instant breakfast  Pureed soups and creamed soups Smooth pudding, mousse, custard  Pureed scalloped apples Whipped gelatin  Gravies Sugar, syrup, honey, jelly  Sauces, cheese, tomato, barbecue, white, creamed Cream  Any baby food Creamer  Alcohol in moderation (not beer or champagne) Margarine  Coffee or tea Mayonnaise   Ketchup, mustard   Apple sauce   SAMPLE MENU:  PUREED DIET Breakfast Lunch Dinner   Orange juice, 1/2 cup  Cream of wheat, 1/2 cup  Pineapple juice, 1/2 cup  Pureed turkey, barley soup, 3/4 cup  Pureed Hawaiian chicken, 3 oz   Scrambled eggs, mashed or blended with cheese, 1/2 cup  Tea or coffee, 1 cup   Whole milk, 1 cup   Non-dairy creamer, 2 Tbsp.  Mashed potatoes, 1/2 cup  Pureed cooled broccoli, 1/2 cup  Apple sauce, 1/2 cup  Coffee or tea  Mashed potatoes, 1/2 cup  Pureed spinach, 1/2 cup  Frozen yogurt, 1/2 cup  Tea or coffee      LEVEL 2 = SOFT DIET  After your first 2 weeks, you can advance to a soft diet.   Keep on this  diet until everything goes down easily.  Hot Foods Cold Foods  White fish Cottage cheese  Stuffed fish Junior baby fruit  Baby food meals Semi thickened juices  Minced soft cooked, scrambled, poached eggs nectars  Souffle & omelets Ripe mashed bananas  Cooked cereals Canned fruit, pineapple sauce, milk  potatoes Milkshake  Buttered or Alfredo noodles Custard  Cooked cooled vegetable Puddings, including tapioca  Sherbet Yogurt  Vegetable soup or alphabet soup Fruit ice, Italian ice  Gravies Whipped gelatin  Sugar, syrup, honey, jelly Junior baby desserts  Sauces:  Cheese, creamed, barbecue, tomato, white Cream  Coffee or tea Margarine   SAMPLE MENU:  LEVEL 2 Breakfast Lunch Dinner   Orange juice, 1/2 cup  Oatmeal, 1/2 cup  Scrambled eggs with cheese, 1/2 cup  Decaffeinated tea, 1 cup  Whole milk, 1 cup    Non-dairy creamer, 2 Tbsp  Pineapple juice, 1/2 cup  Minced beef, 3 oz  Gravy, 2 Tbsp  Mashed potatoes, 1/2 cup  Minced fresh broccoli, 1/2 cup  Applesauce, 1/2 cup  Coffee, 1 cup  Turkey, barley soup, 3/4 cup  Minced Hawaiian chicken, 3 oz  Mashed potatoes, 1/2 cup  Cooked spinach, 1/2 cup  Frozen yogurt, 1/2 cup  Non-dairy creamer, 2 Tbsp      LEVEL 3 = CHOPPED DIET  -After all the foods in level 2 (soft diet) are passing through well you should advance up to more chopped foods.  -It is still important to cut these foods into small pieces and eat slowly.  Hot Foods Cold Foods  Poultry Cottage cheese  Chopped Swedish meatballs Yogurt  Meat salads (ground or flaked meat) Milk  Flaked fish (tuna) Milkshakes  Poached or scrambled eggs Soft, cold, dry cereal  Souffles and omelets Fruit juices or nectars  Cooked cereals Chopped canned fruit  Chopped French toast or pancakes Canned fruit cocktail  Noodles or pasta (no rice) Pudding, mousse, custard  Cooked vegetables (no frozen peas, corn, or mixed vegetables) Green salad  Canned small sweet peas  Ice cream  Creamed soup or vegetable soup Fruit ice, Italian ice  Pureed vegetable soup or alphabet soup Non-dairy creamer  Ground scalloped apples Margarine  Gravies Mayonnaise  Sauces:  Cheese, creamed, barbecue, tomato, white Ketchup  Coffee or tea Mustard   SAMPLE MENU:  LEVEL 3 Breakfast Lunch Dinner   Orange juice, 1/2 cup  Oatmeal, 1/2 cup  Scrambled eggs with cheese, 1/2 cup  Decaffeinated tea, 1 cup  Whole milk, 1 cup  Non-dairy creamer, 2 Tbsp  Ketchup, 1 Tbsp  Margarine, 1 tsp  Salt, 1/4 tsp  Sugar, 2 tsp  Pineapple juice, 1/2 cup  Ground beef, 3 oz  Gravy, 2 Tbsp  Mashed potatoes, 1/2 cup  Cooked spinach, 1/2 cup  Applesauce, 1/2 cup  Decaffeinated coffee  Whole milk  Non-dairy creamer, 2 Tbsp  Margarine, 1 tsp  Salt, 1/4 tsp  Pureed turkey, barley soup, 3/4 cup  Barbecue chicken, 3 oz  Mashed potatoes, 1/2 cup  Ground fresh broccoli, 1/2 cup  Frozen yogurt, 1/2 cup  Decaffeinated tea, 1 cup  Non-dairy creamer, 2 Tbsp  Margarine, 1 tsp  Salt, 1/4 tsp  Sugar, 1 tsp    LEVEL 4:  REGULAR FOODS  -Foods in this group are soft, moist, regularly textured foods.   -This level includes meat and breads, which tend to be the hardest things to swallow.   -Eat very slowly, chew well and continue to avoid carbonated drinks. -most people are at this level in 4-6 weeks  Hot Foods Cold Foods  Baked fish or skinned Soft cheeses - cottage cheese  Souffles and omelets Cream cheese  Eggs Yogurt  Stuffed shells Milk  Spaghetti with meat sauce Milkshakes  Cooked cereal Cold dry cereals (no nuts, dried fruit, coconut)  French toast or pancakes Crackers  Buttered toast Fruit juices or nectars  Noodles or pasta (no rice) Canned fruit  Potatoes (all types) Ripe bananas  Soft, cooked vegetables (no corn, lima, or baked beans) Peeled, ripe, fresh fruit  Creamed soups or vegetable soup Cakes (no nuts, dried fruit, coconut)  Canned chicken  noodle soup Plain doughnuts  Gravies Ice cream  Bacon dressing Pudding, mousse, custard  Sauces:  Cheese, creamed, barbecue, tomato, white Fruit ice, Italian ice, sherbet  Decaffeinated tea or coffee Whipped gelatin  Pork chops Regular gelatin     Canned fruited gelatin molds   Sugar, syrup, honey, jam, jelly   Cream   Non-dairy   Margarine   Oil   Mayonnaise   Ketchup   Mustard   TROUBLESHOOTING IRREGULAR BOWELS  1) Avoid extremes of bowel movements (no bad constipation/diarrhea)  2) Miralax 17gm mixed in 8oz. water or juice-daily. May use BID as needed.  3) Gas-x,Phazyme, etc. as needed for gas & bloating.  4) Soft,bland diet. No spicy,greasy,fried foods.  5) Prilosec over-the-counter as needed  6) May hold gluten/wheat products from diet to see if symptoms improve.  7) May try probiotics (Align, Activa, etc) to help calm the bowels down  7) If symptoms become worse call back immediately.    If you have any questions please call our office at [facility]: [phone].  

## 2020-11-24 NOTE — Anesthesia Procedure Notes (Signed)
Procedure Name: Intubation Date/Time: 11/24/2020 12:14 PM Performed by: Harden Mo, CRNA Pre-anesthesia Checklist: Patient identified, Emergency Drugs available, Suction available and Patient being monitored Patient Re-evaluated:Patient Re-evaluated prior to induction Oxygen Delivery Method: Circle System Utilized Preoxygenation: Pre-oxygenation with 100% oxygen Induction Type: IV induction Laryngoscope Size: Mac and 3 Grade View: Grade I Tube type: Oral Tube size: 7.0 mm Number of attempts: 1 Airway Equipment and Method: Stylet and Oral airway Placement Confirmation: ETT inserted through vocal cords under direct vision, positive ETCO2 and breath sounds checked- equal and bilateral Secured at: 21 cm Tube secured with: Tape Dental Injury: Teeth and Oropharynx as per pre-operative assessment  Comments: Placed by Rexford Maus, SRNA

## 2020-11-24 NOTE — Anesthesia Postprocedure Evaluation (Signed)
Anesthesia Post Note  Patient: Brittany Osborn  Procedure(s) Performed: XI ROBOTIC ASSISTED HIATAL HERNIA REPAIR WITH MESH AND FUNDOPLICATION (Abdomen) INSERTION OF MESH (Abdomen)     Patient location during evaluation: PACU Anesthesia Type: General Level of consciousness: sedated and patient cooperative Pain management: pain level controlled Vital Signs Assessment: post-procedure vital signs reviewed and stable Respiratory status: spontaneous breathing Cardiovascular status: stable Anesthetic complications: no   No notable events documented.  Last Vitals:  Vitals:   11/24/20 1541 11/24/20 2054  BP: (!) 156/71 (!) 143/76  Pulse: 70 88  Resp: 18 16  Temp: 36.6 C (!) 36.4 C  SpO2: 94% 97%    Last Pain:  Vitals:   11/24/20 2054  TempSrc: Oral  PainSc:                  Nolon Nations

## 2020-11-25 ENCOUNTER — Encounter (HOSPITAL_COMMUNITY): Payer: Self-pay | Admitting: General Surgery

## 2020-11-25 ENCOUNTER — Inpatient Hospital Stay (HOSPITAL_COMMUNITY): Payer: Medicare Other

## 2020-11-25 LAB — BASIC METABOLIC PANEL
Anion gap: 6 (ref 5–15)
BUN: 6 mg/dL — ABNORMAL LOW (ref 8–23)
CO2: 23 mmol/L (ref 22–32)
Calcium: 8.2 mg/dL — ABNORMAL LOW (ref 8.9–10.3)
Chloride: 107 mmol/L (ref 98–111)
Creatinine, Ser: 0.69 mg/dL (ref 0.44–1.00)
GFR, Estimated: >60 mL/min (ref >60)
Glucose, Bld: 143 mg/dL — ABNORMAL HIGH (ref 70–99)
Potassium: 3.9 mmol/L (ref 3.5–5.1)
Sodium: 136 mmol/L (ref 135–145)

## 2020-11-25 MED ORDER — HYDROCODONE-ACETAMINOPHEN 7.5-325 MG/15ML PO SOLN: 15.0000 mL | Freq: Four times a day (QID) | ORAL | 0 refills | Status: DC | PRN

## 2020-11-25 MED ORDER — PANTOPRAZOLE SODIUM 40 MG PO TBEC: 40.0000 mg | DELAYED_RELEASE_TABLET | Freq: Every day | ORAL | Status: DC

## 2020-11-25 MED ORDER — IOHEXOL 300 MG/ML  SOLN
100.0000 mL | Freq: Once | INTRAMUSCULAR | Status: AC | PRN
  Administered 2020-11-25: 75 mL via ORAL

## 2020-11-25 NOTE — Discharge Summary (Signed)
Physician Discharge Summary  Patient ID: Brittany Osborn MRN: [id number] DOB/AGE: [birth year] 75 y.o.  Admit date: 11/24/2020 Discharge date: 11/25/2020  Admission Diagnoses:hiatal hernia   Discharge Diagnoses:  S/p robotic hiatal hernia repair with mesh and toupet fundoplication   Discharged Condition: good  Hospital Course: Pt was admitted post op.  PLease see op note for full details.  POD 1 pt underwent esophagram with showed no leak.  She was started on FLD and tol that well.  She was pain free and was able to ambulate on her own.  She was deemed stabel for DC and Dc'd home.  Consults: None  Significant Diagnostic Studies: DG esop = no leak post op  Treatments: surgery:  as above  Discharge Exam: Blood pressure (!) 150/65, pulse 69, temperature 98.3 F (36.8 C), temperature source Oral, resp. rate 19, height 5\' 2"  (1.575 m), weight 69.4 kg, SpO2 98 %. General appearance: alert and cooperative GI: soft, non-tender; bowel sounds normal; no masses,  no organomegaly  Disposition: Discharge disposition: 01-Home or Self Care      Discharge Instructions     Diet - low sodium heart healthy   Complete by: As directed    Increase activity slowly   Complete by: As directed       Allergies as of 11/25/2020       Reactions   Pneumococcal Vaccine Swelling      Strawberry Extract Anaphylaxis   Ciprofloxacin Swelling   Sulfa Drugs Cross Reactors Nausea And Vomiting   Citalopram    Agitation/anger    Codeine Other (See Comments)   Headache   Other    general anesthesia - difficulty waking up   Voltaren [diclofenac] Other (See Comments)   Unknown        Medication List     TAKE these medications    acetaminophen 500 MG tablet Commonly known as: TYLENOL Take 500 mg by mouth 2 (two) times daily as needed for moderate pain or headache.   aspirin EC 81 MG tablet Take 81 mg by mouth at bedtime.   atorvastatin 20 MG tablet Commonly known as: LIPITOR Take 1 tablet  (20 mg total) by mouth daily at 6 PM.   docusate sodium 100 MG capsule Commonly known as: COLACE Take 100 mg by mouth every other day. Alternates with senokot   famotidine 10 MG tablet Commonly known as: PEPCID Take 5 mg by mouth 2 (two) times daily as needed for heartburn or indigestion.   HYDROcodone-acetaminophen 7.5-325 mg/15 ml solution Commonly known as: HYCET Take 15 mLs by mouth 4 (four) times daily as needed for moderate pain.   lisinopril 10 MG tablet Commonly known as: ZESTRIL Take 1 tablet (10 mg total) by mouth daily.   meclizine 12.5 MG tablet Commonly known as: ANTIVERT Take 1 tablet (12.5 mg total) by mouth 3 (three) times daily as needed for dizziness.   melatonin 5 MG Tabs Take 5 mg by mouth 2 (two) times daily as needed (anxiety/jitters).   senna 8.6 MG tablet Commonly known as: SENOKOT Take 1 tablet by mouth every other day. Alternates with docusate   Vitamin D (Ergocalciferol) 1.25 MG (50000 UNIT) Caps capsule Commonly known as: DRISDOL Take 1 capsule (50,000 Units total) by mouth every 7 (seven) days.        Follow-up Information     Ralene Ok, MD. Schedule an appointment as soon as possible for a visit in 2 week(s).   Specialty: General Surgery Contact information: [address] [place] [address] [phone]                 Signed: Ralene Ok 11/25/2020, 3:02 PM

## 2020-11-25 NOTE — Progress Notes (Signed)
1 Day Post-Op   Subjective/Chief Complaint: Pt doing well  Min pain   Objective: Vital signs in last 24 hours: Temp:  [97.5 F (36.4 C)-98.3 F (36.8 C)] 98.3 F (36.8 C) (06/29 1004) Pulse Rate:  [59-88] 59 (06/29 1004) Resp:  [14-19] 19 (06/29 1004) BP: (137-156)/(61-76) 138/62 (06/29 1004) SpO2:  [94 %-100 %] 95 % (06/29 1004) Last BM Date: 11/23/20  Intake/Output from previous day: 06/28 0701 - 06/29 0700 In: 2558.8 [I.V.:2458.8; IV Piggyback:100] Out: 410 [Urine:400; Blood:10] Intake/Output this shift: No intake/output data recorded.  General appearance: alert and cooperative GI: soft, non-tender; bowel sounds normal; no masses,  no organomegaly and inc c/d/i  Lab Results:  No results for input(s): WBC, HGB, HCT, PLT in the last 72 hours. BMET Recent Labs    11/25/20 0254  NA 136  K 3.9  CL 107  CO2 23  GLUCOSE 143*  BUN 6*  CREATININE 0.69  CALCIUM 8.2*   PT/INR No results for input(s): LABPROT, INR in the last 72 hours. ABG No results for input(s): PHART, HCO3 in the last 72 hours.  Invalid input(s): PCO2, PO2  Studies/Results: DG ESOPHAGUS W SINGLE CM (SOL OR THIN BA)  Result Date: 11/25/2020 CLINICAL DATA:  Postop day 1 Nissen fundoplication EXAM: ESOPHOGRAM/BARIUM SWALLOW TECHNIQUE: Single contrast examination was performed using water-soluble contrast. FLUOROSCOPY TIME:  Fluoroscopy Time:  1 minutes 48 seconds Radiation Exposure Index (if provided by the fluoroscopic device): Number of Acquired Spot Images: 7 COMPARISON:  Upper GI 12/11/2018 FINDINGS: Postop Nissen fundoplication. The esophagus is distended and filled with contrast. There is slow passage of contrast through the fundoplication which is patent. No leak. Contrast partially fills the stomach. No esophageal stricture. IMPRESSION: Postop Nissen fundoplication. Slow passage of contrast through the fundoplication. No leak. Electronically Signed   By: Franchot Gallo M.D.   On: 11/25/2020 09:36     Anti-infectives: Anti-infectives (From admission, onward)    Start     Dose/Rate Route Frequency Ordered Stop   11/24/20 1000  ceFAZolin (ANCEF) IVPB 2g/100 mL premix        2 g 200 mL/hr over 30 Minutes Intravenous On call to O.R. 11/24/20 0957 11/24/20 1216       Assessment/Plan: s/p Procedure(s): XI ROBOTIC ASSISTED HIATAL HERNIA REPAIR WITH MESH AND FUNDOPLICATION (N/A) INSERTION OF MESH (N/A) Advance diet to fulls Ambulate Hopefully home later today    LOS: 1 day    Ralene Ok 11/25/2020

## 2020-11-25 NOTE — Progress Notes (Signed)
Discharge instructs provided to patient. Ivs removed. Help patient get dressed, reviewed food choices and medications. Patient discharged

## 2020-11-25 NOTE — Plan of Care (Signed)
RD consulted for nutrition education regarding diet instructions (pureed diet for 2 weeks post-op).  Son at bedside and is willing to help patient with meals and PO intake. He has experience with a pureed diet.   RD provided handout for full liquids/pureed/soft diet. Reviewed patient's dietary recall. Provided examples on ways to modify foods on pureed diet. Encouraged pt to blend foods using a blender or food processor and strain to obtain desired consistency (no lumps). Also encouraged use of commercial pureed foods and oral nutritional supplements such as Ensure to provide adequate calories and protein. Discussed how to modify recipes to add calories and protein.    RD discussed why it is important for patient to adhere to diet recommendations and discussed possibility of diet advancement upon MD follow-up. Teach back method used.   Expect good compliance.    Current diet order is full liquids. Labs and medications reviewed. No further nutrition interventions warranted at this time. RD contact information provided. If additional nutrition issues arise, please re-consult RD.   Lockie Pares., RD, LDN, CNSC See AMiON for contact information

## 2020-12-15 ENCOUNTER — Encounter: Payer: Medicare Other | Admitting: Family Medicine

## 2021-01-19 ENCOUNTER — Other Ambulatory Visit: Payer: Self-pay | Admitting: Family Medicine

## 2021-01-19 DIAGNOSIS — Z8673 Personal history of transient ischemic attack (TIA), and cerebral infarction without residual deficits: Secondary | ICD-10-CM

## 2021-01-19 DIAGNOSIS — E7800 Pure hypercholesterolemia, unspecified: Secondary | ICD-10-CM

## 2021-01-20 ENCOUNTER — Other Ambulatory Visit: Payer: Self-pay | Admitting: Family Medicine

## 2021-01-20 DIAGNOSIS — I10 Essential (primary) hypertension: Secondary | ICD-10-CM

## 2021-02-06 ENCOUNTER — Other Ambulatory Visit: Payer: Self-pay | Admitting: Family Medicine

## 2021-02-06 DIAGNOSIS — E559 Vitamin D deficiency, unspecified: Secondary | ICD-10-CM

## 2021-04-25 ENCOUNTER — Other Ambulatory Visit: Payer: Self-pay | Admitting: Family Medicine

## 2021-04-25 DIAGNOSIS — Z8673 Personal history of transient ischemic attack (TIA), and cerebral infarction without residual deficits: Secondary | ICD-10-CM

## 2021-04-25 DIAGNOSIS — E7800 Pure hypercholesterolemia, unspecified: Secondary | ICD-10-CM

## 2021-04-26 ENCOUNTER — Other Ambulatory Visit: Payer: Self-pay | Admitting: Family Medicine

## 2021-04-26 DIAGNOSIS — I10 Essential (primary) hypertension: Secondary | ICD-10-CM

## 2021-04-30 ENCOUNTER — Encounter: Payer: Self-pay | Admitting: Family Medicine

## 2021-04-30 ENCOUNTER — Ambulatory Visit (INDEPENDENT_AMBULATORY_CARE_PROVIDER_SITE_OTHER): Payer: Medicare Other | Admitting: Family Medicine

## 2021-04-30 VITALS — BP 100/63 | HR 74 | Temp 97.3°F | Ht 62.0 in | Wt 149.0 lb

## 2021-04-30 DIAGNOSIS — R2681 Unsteadiness on feet: Secondary | ICD-10-CM

## 2021-04-30 DIAGNOSIS — Z8673 Personal history of transient ischemic attack (TIA), and cerebral infarction without residual deficits: Secondary | ICD-10-CM

## 2021-04-30 DIAGNOSIS — R531 Weakness: Secondary | ICD-10-CM | POA: Diagnosis not present

## 2021-04-30 DIAGNOSIS — Z8719 Personal history of other diseases of the digestive system: Secondary | ICD-10-CM

## 2021-04-30 DIAGNOSIS — K219 Gastro-esophageal reflux disease without esophagitis: Secondary | ICD-10-CM

## 2021-04-30 DIAGNOSIS — I10 Essential (primary) hypertension: Secondary | ICD-10-CM

## 2021-04-30 DIAGNOSIS — E7800 Pure hypercholesterolemia, unspecified: Secondary | ICD-10-CM | POA: Diagnosis not present

## 2021-04-30 DIAGNOSIS — E559 Vitamin D deficiency, unspecified: Secondary | ICD-10-CM

## 2021-04-30 MED ORDER — LISINOPRIL 10 MG PO TABS: 10.0000 mg | ORAL_TABLET | Freq: Every day | ORAL | 1 refills | Status: DC

## 2021-04-30 MED ORDER — ATORVASTATIN CALCIUM 20 MG PO TABS: 20.0000 mg | ORAL_TABLET | Freq: Every day | ORAL | 1 refills | Status: DC

## 2021-04-30 NOTE — Progress Notes (Signed)
Assessment & Plan:  1. Essential hypertension Well controlled on current regimen. Compression grades provided to patient. Education provided on the DASH diet. - lisinopril (ZESTRIL) 10 MG tablet; Take 1 tablet (10 mg total) by mouth daily.  Dispense: 90 tablet; Refill: 1 - CMP14+EGFR - CBC with Differential/Platelet - Lipid panel  2. Pure hypercholesterolemia Well controlled on current regimen.  - atorvastatin (LIPITOR) 20 MG tablet; Take 1 tablet (20 mg total) by mouth daily at 6 PM.  Dispense: 90 tablet; Refill: 1 - CMP14+EGFR - Lipid panel  3. History of stroke Continue statin and ASA. - atorvastatin (LIPITOR) 20 MG tablet; Take 1 tablet (20 mg total) by mouth daily at 6 PM.  Dispense: 90 tablet; Refill: 1 - CMP14+EGFR - Lipid panel  4. Vitamin D deficiency Labs to assess. - VITAMIN D 25 Hydroxy (Vit-D Deficiency, Fractures)  5. Gastroesophageal reflux disease without esophagitis Well controlled on current regimen.  - CMP14+EGFR  6-8. Generalized weakness/Unstable gait/History of hiatal hernia FMLA forms to be completed for patient. He will return to pick them up.  - Ambulatory referral to Physical Therapy   Return in about 3 months (around 07/29/2021) for annual physical.  Brittany Limes, MSN, APRN, FNP-C [facility]  Subjective:    Patient ID: Brittany Osborn, female    DOB: August 03, 1945, 75 y.o.   MRN: [id number]  Patient Care Team: Brittany Brooklyn, FNP as PCP - General (Family Medicine) Brittany Ada, MD as Consulting Physician (Gastroenterology)   Chief Complaint:  Chief Complaint  Patient presents with   Medical Management of Chronic Issues    Check up of chronic medical conditions    FMLA    HPI: Brittany Osborn is a 75 y.o. female presenting on 04/30/2021 for Medical Management of Chronic Issues (Check up of chronic medical conditions ) and FMLA  Patient is accompanied by her son, Brittany Osborn, who she is okay with being present. He is  currently providing a lot of care for her since her hernia surgery in June.   Hypertension: taking Lisinopril 10 mg daily. Patient's son does check her blood pressure at home and reports readings 110-125/60-70s. Patient does report occasionally swelling of her left ankle that improves with elevation of her legs. She has not tried wearing compression hose as she did not know what grade to purchase.  Hyperlipidemia/History of stroke: taking atorvastatin 20 mg daily and aspirin 81 mg daily.   Vitamin D deficiency: taking 50,000 units weekly.  GERD: taking famotidine 5 mg twice daily as needed.   New complaints: Patient's son is in need of completion of FMLA forms. He reports he has been caring for his mother since her hiatal hernia surgery on 11/24/2020. Upon chart review it appears she had an uncomplicated surgery and was discharged the next day. She had a follow-up with the surgeon on 01/29/2021 at which time their was mention that her energy levels were decreased but slowly picking up. Patient reports she can't do much for herself; if she does, she can't get out of the bed for the next 2-3 days. She is relying on her son to assist with ADLs due to her weakness and being unstable on her feet.    Social history:  Relevant past medical, surgical, family and social history reviewed and updated as indicated. Interim medical history since our last visit reviewed.  Allergies and medications reviewed and updated.  DATA REVIEWED: CHART IN EPIC  ROS: Negative unless specifically indicated above in HPI.  Current Outpatient Medications:    acetaminophen (TYLENOL) 500 MG tablet, Take 500 mg by mouth 2 (two) times daily as needed for moderate pain or headache., Disp: , Rfl:    aspirin EC 81 MG tablet, Take 81 mg by mouth at bedtime., Disp: , Rfl:    atorvastatin (LIPITOR) 20 MG tablet, Take 1 tablet (20 mg total) by mouth daily at 6 PM. (NEEDS TO BE SEEN BEFORE NEXT REFILL), Disp: 30 tablet, Rfl: 0    docusate sodium (COLACE) 100 MG capsule, Take 100 mg by mouth every other day. Alternates with senokot, Disp: , Rfl:    famotidine (PEPCID) 10 MG tablet, Take 5 mg by mouth 2 (two) times daily as needed for heartburn or indigestion., Disp: , Rfl:    lisinopril (ZESTRIL) 10 MG tablet, TAKE 1 TABLET BY MOUTH EVERY DAY, Disp: 30 tablet, Rfl: 0   meclizine (ANTIVERT) 12.5 MG tablet, Take 1 tablet (12.5 mg total) by mouth 3 (three) times daily as needed for dizziness., Disp: 30 tablet, Rfl: 2   melatonin 5 MG TABS, Take 5 mg by mouth 2 (two) times daily as needed (anxiety/jitters)., Disp: , Rfl:    senna (SENOKOT) 8.6 MG tablet, Take 1 tablet by mouth every other day. Alternates with docusate, Disp: , Rfl:    Vitamin D, Ergocalciferol, (DRISDOL) 1.25 MG (50000 UNIT) CAPS capsule, TAKE 1 CAPSULE BY MOUTH EVERY 7 DAYS, Disp: 12 capsule, Rfl: 1   Allergies  Allergen Reactions   Pneumococcal Vaccine Swelling        Strawberry Extract Anaphylaxis   Ciprofloxacin Swelling   Sulfa Drugs Cross Reactors Nausea And Vomiting   Citalopram     Agitation/anger    Codeine Other (See Comments)    Headache    Other     general anesthesia - difficulty waking up   Sulfa Antibiotics Other (See Comments)   Voltaren [Diclofenac] Other (See Comments)    Unknown   Past Medical History:  Diagnosis Date   Anxiety    Arthritis    Cataracts, bilateral    Corrected with surgery   Complication of anesthesia    slow to awaken 6 yrs ago after ruptured hiatal hernia surgery   Family history of adverse reaction to anesthesia    women in family hard to awaken   GERD (gastroesophageal reflux disease)    H/O hiatal hernia    Headache(784.0)    Hyperlipidemia    Hypertension    Stroke ([place]) 09/2018 and old cva not sure when   09-2018 took tpa for, no residual defecits on plavix    Past Surgical History:  Procedure Laterality Date   BIOPSY  02/21/2019   Procedure: BIOPSY;  Surgeon: Alphonsa Overall, MD;  Location: [facility]  ENDOSCOPY;  Service: General;;   CESAREAN SECTION     COLONOSCOPY  09/16/2011   Procedure: COLONOSCOPY;  Surgeon: Beryle Beams, MD;  Location: [facility] ENDOSCOPY;  Service: Endoscopy;  Laterality: N/A;   ESOPHAGOGASTRODUODENOSCOPY  09/16/2011   Procedure: ESOPHAGOGASTRODUODENOSCOPY (EGD);  Surgeon: Beryle Beams, MD;  Location: [facility] ENDOSCOPY;  Service: Endoscopy;  Laterality: N/A;  Per Dr. Benson Norway, pt wants do procedures at [facility] instead of [facility]   ESOPHAGOGASTRODUODENOSCOPY N/A 02/21/2019   Procedure: upper endoscopy with possible dilation;  Surgeon: Alphonsa Overall, MD;  Location: [facility] ENDOSCOPY;  Service: General;  Laterality: N/A;   EYE SURGERY     cataracts both eyes   INSERTION OF MESH N/A 11/24/2020   Procedure: INSERTION OF MESH;  Surgeon: Ralene Ok, MD;  Location: [facility] OR;  Service: General;  Laterality: N/A;   LAPAROSCOPIC NISSEN FUNDOPLICATION N/A 10/29/930   Procedure: LAPAROSCOPIC NISSEN FUNDOPLICATION laparoscopic hiatal hernia repair  ;  Surgeon: Shann Medal, MD;  Location: [facility] ORS;  Service: General;  Laterality: N/A;   TONSILLECTOMY  age 23   XI ROBOTIC ASSISTED HIATAL HERNIA REPAIR N/A 11/24/2020   Procedure: XI ROBOTIC ASSISTED HIATAL HERNIA REPAIR WITH MESH AND [place];  Surgeon: Ralene Ok, MD;  Location: [facility];  Service: General;  Laterality: N/A;    Social History   Socioeconomic History   Marital status: Widowed    Spouse name: Not on file   Number of children: Not on file   Years of education: Not on file   Highest education level: Not on file  Occupational History   Not on file  Tobacco Use   Smoking status: Never   Smokeless tobacco: Never  Vaping Use   Vaping Use: Never used  Substance and Sexual Activity   Alcohol use: No   Drug use: No   Sexual activity: Not Currently  Other Topics Concern   Not on file  Social History Narrative   Lives with son   Right Handed   Drinks very little caffeine, everything is decaffeinated   Social Determinants of  Health   Financial Resource Strain: Not on file  Food Insecurity: Not on file  Transportation Needs: Not on file  Physical Activity: Not on file  Stress: Not on file  Social Connections: Not on file  Intimate Partner Violence: Not on file        Objective:    BP 100/63   Pulse 74   Temp (!) 97.3 F (36.3 C) (Temporal)   Ht 5' 2"  (1.575 m)   Wt 149 lb (67.6 kg)   SpO2 94%   BMI 27.25 kg/m   Wt Readings from Last 3 Encounters:  04/30/21 149 lb (67.6 kg)  11/24/20 153 lb (69.4 kg)  11/18/20 153 lb 11.2 oz (69.7 kg)    Physical Exam Vitals reviewed.  Constitutional:      General: She is not in acute distress.    Appearance: Normal appearance. She is not ill-appearing, toxic-appearing or diaphoretic.  HENT:     Head: Normocephalic and atraumatic.  Eyes:     General: No scleral icterus.       Right eye: No discharge.        Left eye: No discharge.     Conjunctiva/sclera: Conjunctivae normal.  Cardiovascular:     Rate and Rhythm: Normal rate and regular rhythm.     Heart sounds: Normal heart sounds. No murmur heard.   No friction rub. No gallop.  Pulmonary:     Effort: Pulmonary effort is normal. No respiratory distress.     Breath sounds: Normal breath sounds. No stridor. No wheezing, rhonchi or rales.  Musculoskeletal:        General: Normal range of motion.     Cervical back: Normal range of motion.  Skin:    General: Skin is warm and dry.     Capillary Refill: Capillary refill takes less than 2 seconds.  Neurological:     General: No focal deficit present.     Mental Status: She is alert and oriented to person, place, and time. Mental status is at baseline.  Psychiatric:        Mood and Affect: Mood normal.        Behavior: Behavior normal.  Thought Content: Thought content normal.        Judgment: Judgment normal.    No results found for: TSH Lab Results  Component Value Date   WBC 4.4 11/18/2020   HGB 13.8 11/18/2020   HCT 42.0 11/18/2020    MCV 98.4 11/18/2020   PLT 224 11/18/2020   Lab Results  Component Value Date   NA 136 11/25/2020   K 3.9 11/25/2020   CO2 23 11/25/2020   GLUCOSE 143 (H) 11/25/2020   BUN 6 (L) 11/25/2020   CREATININE 0.69 11/25/2020   BILITOT 0.6 05/12/2020   ALKPHOS 65 05/12/2020   AST 18 05/12/2020   ALT 14 05/12/2020   PROT 6.9 05/12/2020   ALBUMIN 4.1 05/12/2020   CALCIUM 8.2 (L) 11/25/2020   ANIONGAP 6 11/25/2020   EGFR 85 09/15/2020   Lab Results  Component Value Date   CHOL 142 05/12/2020   Lab Results  Component Value Date   HDL 59 05/12/2020   Lab Results  Component Value Date   LDLCALC 66 05/12/2020   Lab Results  Component Value Date   TRIG 92 05/12/2020   Lab Results  Component Value Date   CHOLHDL 2.4 05/12/2020   Lab Results  Component Value Date   HGBA1C 5.6 09/21/2020

## 2021-04-30 NOTE — Patient Instructions (Signed)
15 to 20 mmHg (moderate compression) 20 to 30 mmHg (firm compression)

## 2021-05-01 LAB — CBC WITH DIFFERENTIAL/PLATELET
Basophils Absolute: 0 10*3/uL (ref 0.0–0.2)
Basos: 1 %
EOS (ABSOLUTE): 0.1 10*3/uL (ref 0.0–0.4)
Eos: 1 %
Hematocrit: 43.4 % (ref 34.0–46.6)
Hemoglobin: 14.5 g/dL (ref 11.1–15.9)
Immature Grans (Abs): 0 10*3/uL (ref 0.0–0.1)
Immature Granulocytes: 0 %
Lymphocytes Absolute: 1.4 10*3/uL (ref 0.7–3.1)
Lymphs: 23 %
MCH: 32.2 pg (ref 26.6–33.0)
MCHC: 33.4 g/dL (ref 31.5–35.7)
MCV: 96 fL (ref 79–97)
Monocytes Absolute: 0.6 10*3/uL (ref 0.1–0.9)
Monocytes: 9 %
Neutrophils Absolute: 4 10*3/uL (ref 1.4–7.0)
Neutrophils: 66 %
Platelets: 239 10*3/uL (ref 150–450)
RBC: 4.5 x10E6/uL (ref 3.77–5.28)
RDW: 11.8 % (ref 11.7–15.4)
WBC: 6 10*3/uL (ref 3.4–10.8)

## 2021-05-01 LAB — CMP14+EGFR
ALT: 14 IU/L (ref 0–32)
AST: 17 IU/L (ref 0–40)
Albumin/Globulin Ratio: 1.9 (ref 1.2–2.2)
Albumin: 4.4 g/dL (ref 3.7–4.7)
Alkaline Phosphatase: 73 IU/L (ref 44–121)
BUN/Creatinine Ratio: 19 (ref 12–28)
BUN: 20 mg/dL (ref 8–27)
Bilirubin Total: 0.6 mg/dL (ref 0.0–1.2)
CO2: 23 mmol/L (ref 20–29)
Calcium: 9.3 mg/dL (ref 8.7–10.3)
Chloride: 104 mmol/L (ref 96–106)
Creatinine, Ser: 1.03 mg/dL — ABNORMAL HIGH (ref 0.57–1.00)
Globulin, Total: 2.3 g/dL (ref 1.5–4.5)
Glucose: 92 mg/dL (ref 70–99)
Potassium: 4.5 mmol/L (ref 3.5–5.2)
Sodium: 140 mmol/L (ref 134–144)
Total Protein: 6.7 g/dL (ref 6.0–8.5)
eGFR: 57 mL/min/{1.73_m2} — ABNORMAL LOW (ref >59)

## 2021-05-01 LAB — LIPID PANEL
Chol/HDL Ratio: 2.4 ratio (ref 0.0–4.4)
Cholesterol, Total: 144 mg/dL (ref 100–199)
HDL: 60 mg/dL (ref >39)
LDL Chol Calc (NIH): 64 mg/dL (ref 0–99)
Triglycerides: 110 mg/dL (ref 0–149)
VLDL Cholesterol Cal: 20 mg/dL (ref 5–40)

## 2021-05-01 LAB — VITAMIN D 25 HYDROXY (VIT D DEFICIENCY, FRACTURES): Vit D, 25-Hydroxy: 61.4 ng/mL (ref 30.0–100.0)

## 2021-05-03 ENCOUNTER — Encounter: Payer: Self-pay | Admitting: Family Medicine

## 2021-05-03 DIAGNOSIS — K5900 Constipation, unspecified: Secondary | ICD-10-CM | POA: Insufficient documentation

## 2021-07-01 DIAGNOSIS — M6281 Muscle weakness (generalized): Secondary | ICD-10-CM | POA: Diagnosis not present

## 2021-07-07 DIAGNOSIS — M6281 Muscle weakness (generalized): Secondary | ICD-10-CM | POA: Diagnosis not present

## 2021-07-09 DIAGNOSIS — M6281 Muscle weakness (generalized): Secondary | ICD-10-CM | POA: Diagnosis not present

## 2021-07-15 DIAGNOSIS — M6281 Muscle weakness (generalized): Secondary | ICD-10-CM | POA: Diagnosis not present

## 2021-07-19 DIAGNOSIS — M6281 Muscle weakness (generalized): Secondary | ICD-10-CM | POA: Diagnosis not present

## 2021-07-21 ENCOUNTER — Other Ambulatory Visit: Payer: Self-pay | Admitting: Family Medicine

## 2021-07-21 DIAGNOSIS — E559 Vitamin D deficiency, unspecified: Secondary | ICD-10-CM

## 2021-07-22 DIAGNOSIS — M6281 Muscle weakness (generalized): Secondary | ICD-10-CM | POA: Diagnosis not present

## 2021-07-26 DIAGNOSIS — M6281 Muscle weakness (generalized): Secondary | ICD-10-CM | POA: Diagnosis not present

## 2021-08-13 DIAGNOSIS — M6281 Muscle weakness (generalized): Secondary | ICD-10-CM | POA: Diagnosis not present

## 2021-08-18 DIAGNOSIS — M6281 Muscle weakness (generalized): Secondary | ICD-10-CM | POA: Diagnosis not present

## 2021-08-25 DIAGNOSIS — M6281 Muscle weakness (generalized): Secondary | ICD-10-CM | POA: Diagnosis not present

## 2021-09-01 DIAGNOSIS — M6281 Muscle weakness (generalized): Secondary | ICD-10-CM | POA: Diagnosis not present

## 2021-09-08 DIAGNOSIS — M6281 Muscle weakness (generalized): Secondary | ICD-10-CM | POA: Diagnosis not present

## 2021-09-16 DIAGNOSIS — H5213 Myopia, bilateral: Secondary | ICD-10-CM | POA: Diagnosis not present

## 2021-09-16 DIAGNOSIS — Z961 Presence of intraocular lens: Secondary | ICD-10-CM | POA: Diagnosis not present

## 2021-11-26 ENCOUNTER — Other Ambulatory Visit: Payer: Self-pay | Admitting: Family Medicine

## 2021-11-26 DIAGNOSIS — E7800 Pure hypercholesterolemia, unspecified: Secondary | ICD-10-CM

## 2021-11-26 DIAGNOSIS — Z8673 Personal history of transient ischemic attack (TIA), and cerebral infarction without residual deficits: Secondary | ICD-10-CM

## 2021-11-26 DIAGNOSIS — I10 Essential (primary) hypertension: Secondary | ICD-10-CM

## 2021-12-24 ENCOUNTER — Other Ambulatory Visit: Payer: Self-pay | Admitting: Family Medicine

## 2021-12-24 ENCOUNTER — Encounter: Payer: Self-pay | Admitting: Family Medicine

## 2021-12-24 DIAGNOSIS — I10 Essential (primary) hypertension: Secondary | ICD-10-CM

## 2021-12-24 DIAGNOSIS — E7800 Pure hypercholesterolemia, unspecified: Secondary | ICD-10-CM

## 2021-12-24 DIAGNOSIS — Z8673 Personal history of transient ischemic attack (TIA), and cerebral infarction without residual deficits: Secondary | ICD-10-CM

## 2021-12-24 NOTE — Telephone Encounter (Signed)
Britney? NTBS 30 days given 11/26/21

## 2021-12-24 NOTE — Telephone Encounter (Signed)
Lmtcb. Letter mailed 

## 2021-12-31 ENCOUNTER — Encounter: Payer: Self-pay | Admitting: Family Medicine

## 2021-12-31 ENCOUNTER — Ambulatory Visit (INDEPENDENT_AMBULATORY_CARE_PROVIDER_SITE_OTHER): Payer: Medicare Other | Admitting: Family Medicine

## 2021-12-31 VITALS — BP 131/78 | HR 76 | Temp 98.1°F | Resp 20 | Ht 62.0 in | Wt 160.0 lb

## 2021-12-31 DIAGNOSIS — E7800 Pure hypercholesterolemia, unspecified: Secondary | ICD-10-CM | POA: Diagnosis not present

## 2021-12-31 DIAGNOSIS — K219 Gastro-esophageal reflux disease without esophagitis: Secondary | ICD-10-CM | POA: Diagnosis not present

## 2021-12-31 DIAGNOSIS — E559 Vitamin D deficiency, unspecified: Secondary | ICD-10-CM | POA: Diagnosis not present

## 2021-12-31 DIAGNOSIS — Z8673 Personal history of transient ischemic attack (TIA), and cerebral infarction without residual deficits: Secondary | ICD-10-CM | POA: Diagnosis not present

## 2021-12-31 DIAGNOSIS — I10 Essential (primary) hypertension: Secondary | ICD-10-CM

## 2021-12-31 MED ORDER — ATORVASTATIN CALCIUM 20 MG PO TABS: 20.0000 mg | ORAL_TABLET | Freq: Every day | ORAL | 1 refills | Status: DC

## 2021-12-31 MED ORDER — LISINOPRIL 10 MG PO TABS: 10.0000 mg | ORAL_TABLET | Freq: Every day | ORAL | 1 refills | Status: DC

## 2021-12-31 NOTE — Progress Notes (Signed)
Assessment & Plan:  1. Essential hypertension Well controlled on current regimen.  - lisinopril (ZESTRIL) 10 MG tablet; Take 1 tablet (10 mg total) by mouth daily.  Dispense: 90 tablet; Refill: 1 - CBC with Differential/Platelet - CMP14+EGFR - Lipid panel  2. Pure hypercholesterolemia Well controlled on current regimen.  - atorvastatin (LIPITOR) 20 MG tablet; Take 1 tablet (20 mg total) by mouth daily.  Dispense: 90 tablet; Refill: 1 - CBC with Differential/Platelet - CMP14+EGFR - Lipid panel  3. History of stroke Continue current regimen. - atorvastatin (LIPITOR) 20 MG tablet; Take 1 tablet (20 mg total) by mouth daily.  Dispense: 90 tablet; Refill: 1  4. Vitamin D deficiency Labs to reassess after switching from weekly to daily supplement. - VITAMIN D 25 Hydroxy (Vit-D Deficiency, Fractures)  5. Gastroesophageal reflux disease without esophagitis Well controlled on current regimen.  - CMP14+EGFR   Return in about 6 months (around 07/03/2022) for annual physical.  Hendricks Limes, MSN, APRN, FNP-C [facility]  Subjective:    Patient ID: Brittany [facility], female    DOB: November 26, 1945, 76 y.o.   MRN: [id number]  Patient Care Team: Loman Brooklyn, FNP as PCP - General (Family Medicine) Carol Ada, MD as Consulting Physician (Gastroenterology)   Chief Complaint:  Chief Complaint  Patient presents with   Medical Management of Chronic Issues    HPI: Brittany Osborn is a 76 y.o. female presenting on 12/31/2021 for Medical Management of Chronic Issues  Patient is accompanied by her son, Brittany Osborn, who she is okay with being present.   Hypertension: taking Lisinopril 10 mg daily. Patient's son does check her blood pressure at home and reports readings 125-135/60-70s. Patient does report occasionally swelling that improves with elevation of her legs. She is wearing compression hose daily.   Hyperlipidemia/History of stroke: taking atorvastatin 20 mg daily  and aspirin 81 mg daily.   Vitamin D deficiency: taking 1,000 units daily.  GERD: taking famotidine 5 mg twice daily as needed.   New complaints: None   Social history:  Relevant past medical, surgical, family and social history reviewed and updated as indicated. Interim medical history since our last visit reviewed.  Allergies and medications reviewed and updated.  DATA REVIEWED: CHART IN EPIC  ROS: Negative unless specifically indicated above in HPI.    Current Outpatient Medications:    acetaminophen (TYLENOL) 500 MG tablet, Take 500 mg by mouth 2 (two) times daily as needed for moderate pain or headache., Disp: , Rfl:    aspirin EC 81 MG tablet, Take 81 mg by mouth at bedtime., Disp: , Rfl:    atorvastatin (LIPITOR) 20 MG tablet, TAKE 1 TABLET BY MOUTH DAILY AT 6 PM., Disp: 30 tablet, Rfl: 0   Cholecalciferol (VITAMIN D3) 25 MCG (1000 UT) CAPS, Take 1,000 Units by mouth daily., Disp: , Rfl:    docusate sodium (COLACE) 100 MG capsule, Take 100 mg by mouth every other day. Alternates with senokot, Disp: , Rfl:    famotidine (PEPCID) 10 MG tablet, Take 5 mg by mouth 2 (two) times daily as needed for heartburn or indigestion., Disp: , Rfl:    lisinopril (ZESTRIL) 10 MG tablet, TAKE 1 TABLET BY MOUTH EVERY DAY, Disp: 30 tablet, Rfl: 0   melatonin 5 MG TABS, Take 5 mg by mouth 2 (two) times daily as needed (anxiety/jitters)., Disp: , Rfl:    senna (SENOKOT) 8.6 MG tablet, Take 1 tablet by mouth every other day. Alternates with docusate, Disp: ,  Rfl:    meclizine (ANTIVERT) 12.5 MG tablet, Take 1 tablet (12.5 mg total) by mouth 3 (three) times daily as needed for dizziness. (Patient not taking: Reported on 12/31/2021), Disp: 30 tablet, Rfl: 2   Allergies  Allergen Reactions   Pneumococcal Vaccine Swelling        Strawberry Extract Anaphylaxis   Ciprofloxacin Swelling   Sulfa Drugs Cross Reactors Nausea And Vomiting   Citalopram     Agitation/anger    Codeine Other (See  Comments)    Headache    Other     general anesthesia - difficulty waking up   Sulfa Antibiotics Other (See Comments)   Voltaren [Diclofenac] Other (See Comments)    Unknown   Past Medical History:  Diagnosis Date   Anxiety    Arthritis    Cataracts, bilateral    Corrected with surgery   Complication of anesthesia    slow to awaken 6 yrs ago after ruptured hiatal hernia surgery   Family history of adverse reaction to anesthesia    women in family hard to awaken   GERD (gastroesophageal reflux disease)    H/O hiatal hernia    Headache(784.0)    Hyperlipidemia    Hypertension    Stroke ([place]) 09/2018 and old cva not sure when   09-2018 took tpa for, no residual defecits on plavix    Past Surgical History:  Procedure Laterality Date   BIOPSY  02/21/2019   Procedure: BIOPSY;  Surgeon: Alphonsa Overall, MD;  Location: [facility] ENDOSCOPY;  Service: General;;   CESAREAN SECTION     COLONOSCOPY  09/16/2011   Procedure: COLONOSCOPY;  Surgeon: Beryle Beams, MD;  Location: [facility] ENDOSCOPY;  Service: Endoscopy;  Laterality: N/A;   ESOPHAGOGASTRODUODENOSCOPY  09/16/2011   Procedure: ESOPHAGOGASTRODUODENOSCOPY (EGD);  Surgeon: Beryle Beams, MD;  Location: [facility] ENDOSCOPY;  Service: Endoscopy;  Laterality: N/A;  Per Dr. Benson Norway, pt wants do procedures at [facility] instead of [facility]   ESOPHAGOGASTRODUODENOSCOPY N/A 02/21/2019   Procedure: upper endoscopy with possible dilation;  Surgeon: Alphonsa Overall, MD;  Location: [facility] ENDOSCOPY;  Service: General;  Laterality: N/A;   EYE SURGERY     cataracts both eyes   INSERTION OF MESH N/A 11/24/2020   Procedure: INSERTION OF MESH;  Surgeon: Ralene Ok, MD;  Location: [facility];  Service: General;  Laterality: N/A;   LAPAROSCOPIC NISSEN FUNDOPLICATION N/A 2/83/6629   Procedure: LAPAROSCOPIC NISSEN FUNDOPLICATION laparoscopic hiatal hernia repair  ;  Surgeon: Shann Medal, MD;  Location: [facility] OR[facility];  Service: General;  Laterality: N/A;   TONSILLECTOMY  age 37   XI ROBOTIC ASSISTED  HIATAL HERNIA REPAIR N/A 11/24/2020   Procedure: XI ROBOTIC ASSISTED HIATAL HERNIA REPAIR WITH MESH AND [place];  Surgeon: Ralene Ok, MD;  Location: [facility];  Service: General;  Laterality: N/A;    Social History   Socioeconomic History   Marital status: Widowed    Spouse name: Not on file   Number of children: Not on file   Years of education: Not on file   Highest education level: Not on file  Occupational History   Not on file  Tobacco Use   Smoking status: Never   Smokeless tobacco: Never  Vaping Use   Vaping Use: Never used  Substance and Sexual Activity   Alcohol use: No   Drug use: No   Sexual activity: Not Currently  Other Topics Concern   Not on file  Social History Narrative   Lives with son   Right Handed  Drinks very little caffeine, everything is decaffeinated   Social Determinants of Radio broadcast assistant Strain: Not on file  Food Insecurity: Not on file  Transportation Needs: Not on file  Physical Activity: Not on file  Stress: Not on file  Social Connections: Not on file  Intimate Partner Violence: Not on file        Objective:    BP 131/78   Pulse 76   Temp 98.1 F (36.7 C)   Resp 20   Ht _0  (1.575 m)   Wt 160 lb (72.6 kg)   SpO2 95%   BMI 29.26 kg/m   Wt Readings from Last 3 Encounters:  12/31/21 160 lb (72.6 kg)  04/30/21 149 lb (67.6 kg)  11/24/20 153 lb (69.4 kg)    Physical Exam Vitals reviewed.  Constitutional:      General: She is not in acute distress.    Appearance: Normal appearance. She is not ill-appearing, toxic-appearing or diaphoretic.  HENT:     Head: Normocephalic and atraumatic.  Eyes:     General: No scleral icterus.       Right eye: No discharge.        Left eye: No discharge.     Conjunctiva/sclera: Conjunctivae normal.  Cardiovascular:     Rate and Rhythm: Normal rate and regular rhythm.     Heart sounds: Normal heart sounds. No murmur heard.    No friction rub. No gallop.   Pulmonary:     Effort: Pulmonary effort is normal. No respiratory distress.     Breath sounds: Normal breath sounds. No stridor. No wheezing, rhonchi or rales.  Musculoskeletal:        General: Normal range of motion.     Cervical back: Normal range of motion.  Skin:    General: Skin is warm and dry.     Capillary Refill: Capillary refill takes less than 2 seconds.  Neurological:     General: No focal deficit present.     Mental Status: She is alert and oriented to person, place, and time. Mental status is at baseline.  Psychiatric:        Mood and Affect: Mood normal.        Behavior: Behavior normal.        Thought Content: Thought content normal.        Judgment: Judgment normal.     No results found for: "TSH" Lab Results  Component Value Date   WBC 6.0 04/30/2021   HGB 14.5 04/30/2021   HCT 43.4 04/30/2021   MCV 96 04/30/2021   PLT 239 04/30/2021   Lab Results  Component Value Date   NA 140 04/30/2021   K 4.5 04/30/2021   CO2 23 04/30/2021   GLUCOSE 92 04/30/2021   BUN 20 04/30/2021   CREATININE 1.03 (H) 04/30/2021   BILITOT 0.6 04/30/2021   ALKPHOS 73 04/30/2021   AST 17 04/30/2021   ALT 14 04/30/2021   PROT 6.7 04/30/2021   ALBUMIN 4.4 04/30/2021   CALCIUM 9.3 04/30/2021   ANIONGAP 6 11/25/2020   EGFR 57 (L) 04/30/2021   Lab Results  Component Value Date   CHOL 144 04/30/2021   Lab Results  Component Value Date   HDL 60 04/30/2021   Lab Results  Component Value Date   LDLCALC 64 04/30/2021   Lab Results  Component Value Date   TRIG 110 04/30/2021   Lab Results  Component Value Date   CHOLHDL 2.4 04/30/2021   Lab  Results  Component Value Date   HGBA1C 5.6 09/21/2020

## 2022-01-01 LAB — CBC WITH DIFFERENTIAL/PLATELET
Basophils Absolute: 0 10*3/uL (ref 0.0–0.2)
Basos: 1 %
EOS (ABSOLUTE): 0.1 10*3/uL (ref 0.0–0.4)
Eos: 2 %
Hematocrit: 42.2 % (ref 34.0–46.6)
Hemoglobin: 14.2 g/dL (ref 11.1–15.9)
Immature Grans (Abs): 0 10*3/uL (ref 0.0–0.1)
Immature Granulocytes: 0 %
Lymphocytes Absolute: 1.1 10*3/uL (ref 0.7–3.1)
Lymphs: 31 %
MCH: 32.1 pg (ref 26.6–33.0)
MCHC: 33.6 g/dL (ref 31.5–35.7)
MCV: 96 fL (ref 79–97)
Monocytes Absolute: 0.4 10*3/uL (ref 0.1–0.9)
Monocytes: 12 %
Neutrophils Absolute: 1.9 10*3/uL (ref 1.4–7.0)
Neutrophils: 54 %
Platelets: 220 10*3/uL (ref 150–450)
RBC: 4.42 x10E6/uL (ref 3.77–5.28)
RDW: 12.6 % (ref 11.7–15.4)
WBC: 3.5 10*3/uL (ref 3.4–10.8)

## 2022-01-01 LAB — CMP14+EGFR
ALT: 17 IU/L (ref 0–32)
AST: 19 IU/L (ref 0–40)
Albumin/Globulin Ratio: 2 (ref 1.2–2.2)
Albumin: 4.3 g/dL (ref 3.8–4.8)
Alkaline Phosphatase: 67 IU/L (ref 44–121)
BUN/Creatinine Ratio: 13 (ref 12–28)
BUN: 11 mg/dL (ref 8–27)
Bilirubin Total: 0.6 mg/dL (ref 0.0–1.2)
CO2: 23 mmol/L (ref 20–29)
Calcium: 9.2 mg/dL (ref 8.7–10.3)
Chloride: 106 mmol/L (ref 96–106)
Creatinine, Ser: 0.82 mg/dL (ref 0.57–1.00)
Globulin, Total: 2.1 g/dL (ref 1.5–4.5)
Glucose: 101 mg/dL — ABNORMAL HIGH (ref 70–99)
Potassium: 4.3 mmol/L (ref 3.5–5.2)
Sodium: 141 mmol/L (ref 134–144)
Total Protein: 6.4 g/dL (ref 6.0–8.5)
eGFR: 75 mL/min/{1.73_m2} (ref >59)

## 2022-01-01 LAB — LIPID PANEL
Chol/HDL Ratio: 2.2 ratio (ref 0.0–4.4)
Cholesterol, Total: 132 mg/dL (ref 100–199)
HDL: 61 mg/dL (ref >39)
LDL Chol Calc (NIH): 54 mg/dL (ref 0–99)
Triglycerides: 87 mg/dL (ref 0–149)
VLDL Cholesterol Cal: 17 mg/dL (ref 5–40)

## 2022-01-01 LAB — VITAMIN D 25 HYDROXY (VIT D DEFICIENCY, FRACTURES): Vit D, 25-Hydroxy: 44 ng/mL (ref 30.0–100.0)

## 2022-03-15 ENCOUNTER — Encounter: Payer: Self-pay | Admitting: Family Medicine

## 2022-03-15 ENCOUNTER — Ambulatory Visit (INDEPENDENT_AMBULATORY_CARE_PROVIDER_SITE_OTHER): Payer: Medicare Other | Admitting: Family Medicine

## 2022-03-15 VITALS — BP 129/69 | HR 64 | Temp 97.3°F | Ht 62.0 in | Wt 163.2 lb

## 2022-03-15 DIAGNOSIS — R300 Dysuria: Secondary | ICD-10-CM | POA: Diagnosis not present

## 2022-03-15 DIAGNOSIS — N3090 Cystitis, unspecified without hematuria: Secondary | ICD-10-CM | POA: Diagnosis not present

## 2022-03-15 LAB — URINALYSIS, COMPLETE
Bilirubin, UA: NEGATIVE
Glucose, UA: NEGATIVE
Ketones, UA: NEGATIVE
Leukocytes,UA: NEGATIVE
Nitrite, UA: NEGATIVE
Protein,UA: NEGATIVE
Specific Gravity, UA: 1.025 (ref 1.005–1.030)
Urobilinogen, Ur: 1 mg/dL (ref 0.2–1.0)
pH, UA: 5.5 (ref 5.0–7.5)

## 2022-03-15 LAB — MICROSCOPIC EXAMINATION: Renal Epithel, UA: NONE SEEN /hpf

## 2022-03-15 MED ORDER — AMOXICILLIN 500 MG PO CAPS: 500.0000 mg | ORAL_CAPSULE | Freq: Three times a day (TID) | ORAL | 0 refills | Status: DC

## 2022-03-15 NOTE — Progress Notes (Signed)
Subjective:  Patient ID: Brittany Osborn, female    DOB: [birth year]  Age: 76 y.o. MRN: [id number]  CC: Dysuria   HPI Brittany Osborn presents for hurts to urinate. Having urgency and frequency well. Cranberry juice didn't help this time. Denies fever. Awakens clammy though,. Onset 1 week ago.       03/15/2022    9:34 AM 12/31/2021   11:12 AM 04/30/2021    2:17 PM  Depression screen PHQ 2/9  Decreased Interest 0 0 0  Down, Depressed, Hopeless 0 0 0  PHQ - 2 Score 0 0 0  Altered sleeping   0  Tired, decreased energy   0  Change in appetite   0  Feeling bad or failure about yourself    0  Trouble concentrating   0  Moving slowly or fidgety/restless   0  Suicidal thoughts   0  PHQ-9 Score   0  Difficult doing work/chores   Not difficult at all    History Brittany Osborn has a past medical history of Anxiety, Arthritis, Cataracts, bilateral, Complication of anesthesia, Family history of adverse reaction to anesthesia, GERD (gastroesophageal reflux disease), H/O hiatal hernia, Headache(784.0), Hyperlipidemia, Hypertension, and Stroke ([place]) (09/2018 and old cva not sure when).   She has a past surgical history that includes Esophagogastroduodenoscopy (09/16/2011); Colonoscopy (09/16/2011); Cesarean section; Laparoscopic Nissen fundoplication (N/A, 09/05/8117); Eye surgery; Tonsillectomy (age 48); Esophagogastroduodenoscopy (N/A, 02/21/2019); biopsy (02/21/2019); Xi robotic assisted hiatal hernia repair (N/A, 11/24/2020); and Insertion of mesh (N/A, 11/24/2020).   Her family history includes Alcohol abuse in her brother; Heart disease in her mother; Lung cancer in her father.She reports that she has never smoked. She has never used smokeless tobacco. She reports that she does not drink alcohol and does not use drugs.    ROS Review of Systems  Constitutional:  Negative for chills, diaphoresis and fever.  HENT:  Negative for congestion.   Eyes:  Negative for visual disturbance.  Respiratory:  Negative for  cough and shortness of breath.   Cardiovascular:  Negative for chest pain and palpitations.  Gastrointestinal:  Negative for constipation, diarrhea and nausea.  Genitourinary:  Positive for dysuria, frequency and urgency. Negative for decreased urine volume, flank pain, hematuria, menstrual problem and pelvic pain.  Musculoskeletal:  Negative for arthralgias and joint swelling.  Skin:  Negative for rash.  Neurological:  Negative for dizziness and numbness.    Objective:  BP 129/69   Pulse 64   Temp (!) 97.3 F (36.3 C)   Ht '5\' 2"'$  (1.575 m)   Wt 163 lb 3.2 oz (74 kg)   SpO2 96%   BMI 29.85 kg/m   BP Readings from Last 3 Encounters:  03/15/22 129/69  12/31/21 131/78  04/30/21 100/63    Wt Readings from Last 3 Encounters:  03/15/22 163 lb 3.2 oz (74 kg)  12/31/21 160 lb (72.6 kg)  04/30/21 149 lb (67.6 kg)     Physical Exam Constitutional:      General: She is not in acute distress.    Appearance: She is well-developed.  Cardiovascular:     Rate and Rhythm: Normal rate and regular rhythm.  Pulmonary:     Breath sounds: Normal breath sounds.  Abdominal:     Palpations: Abdomen is soft.     Tenderness: There is no abdominal tenderness.  Musculoskeletal:        General: Normal range of motion.  Skin:    General: Skin is warm and dry.  Neurological:  Mental Status: She is alert and oriented to person, place, and time.    UA noted to have 2+ blood, but only 0-2 RBC on micro. 0-5 WBC and few bacteria   Assessment & Plan:   Brittany Osborn was seen today for dysuria.  Diagnoses and all orders for this visit:  Cystitis -     Urinalysis, Complete -     Urine Culture  Other orders -     amoxicillin (AMOXIL) 500 MG capsule; Take 1 capsule (500 mg total) by mouth 3 (three) times daily.       I am having Brittany Osborn start on amoxicillin. I am also having her maintain her melatonin, docusate sodium, acetaminophen, aspirin EC, famotidine, senna, Vitamin D3,  lisinopril, and atorvastatin.  Allergies as of 03/15/2022       Reactions   Pneumococcal Vaccine Swelling      Strawberry Extract Anaphylaxis   Ciprofloxacin Swelling   Sulfa Drugs Cross Reactors Nausea And Vomiting   Citalopram    Agitation/anger    Codeine Other (See Comments)   Headache   Other    general anesthesia - difficulty waking up   Sulfa Antibiotics Other (See Comments)   Voltaren [diclofenac] Other (See Comments)   Unknown        Medication List        Accurate as of March 15, 2022  9:53 AM. If you have any questions, ask your nurse or doctor.          acetaminophen 500 MG tablet Commonly known as: TYLENOL Take 500 mg by mouth 2 (two) times daily as needed for moderate pain or headache.   amoxicillin 500 MG capsule Commonly known as: AMOXIL Take 1 capsule (500 mg total) by mouth 3 (three) times daily. Started by: Claretta Fraise, MD   aspirin EC 81 MG tablet Take 81 mg by mouth at bedtime.   atorvastatin 20 MG tablet Commonly known as: LIPITOR Take 1 tablet (20 mg total) by mouth daily.   docusate sodium 100 MG capsule Commonly known as: COLACE Take 100 mg by mouth every other day. Alternates with senokot   famotidine 10 MG tablet Commonly known as: PEPCID Take 5 mg by mouth 2 (two) times daily as needed for heartburn or indigestion.   lisinopril 10 MG tablet Commonly known as: ZESTRIL Take 1 tablet (10 mg total) by mouth daily.   melatonin 5 MG Tabs Take 5 mg by mouth 2 (two) times daily as needed (anxiety/jitters).   senna 8.6 MG tablet Commonly known as: SENOKOT Take 1 tablet by mouth every other day. Alternates with docusate   Vitamin D3 25 MCG (1000 UT) Caps Take 1,000 Units by mouth daily.         Follow-up: Return if symptoms worsen or fail to improve.  Claretta Fraise, M.D.

## 2022-03-18 LAB — URINE CULTURE

## 2022-03-19 ENCOUNTER — Other Ambulatory Visit: Payer: Self-pay | Admitting: Family Medicine

## 2022-03-19 MED ORDER — DOXYCYCLINE HYCLATE 100 MG PO CAPS: 100.0000 mg | ORAL_CAPSULE | Freq: Two times a day (BID) | ORAL | 0 refills | Status: DC

## 2022-03-19 NOTE — Progress Notes (Signed)
Your urine culture shows the presence of a germ that is resistant to the current antibiotic you are taking. Please discontinue that medication and take the new one I have sent to your pharmacy.  Best Regards,  , M.D.  

## 2022-03-23 ENCOUNTER — Encounter: Payer: Self-pay | Admitting: *Deleted

## 2022-04-05 ENCOUNTER — Encounter: Payer: Self-pay | Admitting: Family Medicine

## 2022-04-05 ENCOUNTER — Ambulatory Visit (INDEPENDENT_AMBULATORY_CARE_PROVIDER_SITE_OTHER): Payer: Medicare Other | Admitting: Family Medicine

## 2022-04-05 VITALS — BP 137/74 | HR 66 | Temp 97.5°F | Ht 62.0 in | Wt 161.8 lb

## 2022-04-05 DIAGNOSIS — R300 Dysuria: Secondary | ICD-10-CM

## 2022-04-05 LAB — URINALYSIS, COMPLETE
Bilirubin, UA: NEGATIVE
Glucose, UA: NEGATIVE
Ketones, UA: NEGATIVE
Leukocytes,UA: NEGATIVE
Nitrite, UA: NEGATIVE
Protein,UA: NEGATIVE
Specific Gravity, UA: >1.03 — ABNORMAL HIGH (ref 1.005–1.030)
Urobilinogen, Ur: 0.2 mg/dL (ref 0.2–1.0)
pH, UA: 5.5 (ref 5.0–7.5)

## 2022-04-05 LAB — MICROSCOPIC EXAMINATION: Renal Epithel, UA: NONE SEEN /HPF

## 2022-04-05 NOTE — Progress Notes (Signed)
Chief Complaint  Patient presents with   Dysuria    HPI  Patient presents today for concern for bladder infection. Going less than usual   PMH: Smoking status noted ROS: Per HPI  Objective: BP 137/74   Pulse 66   Temp (!) 97.5 F (36.4 C)   Ht '5\' 2"'$  (1.575 m)   Wt 161 lb 12.8 oz (73.4 kg)   SpO2 93%   BMI 29.59 kg/m  Gen: NAD, alert, cooperative with exam HEENT: NCAT, EOMI, PERRL CV: RRR, good S1/S2, no murmur Resp: CTABL, no wheezes, non-labored Abd: SNTND, BS present, no guarding or organomegaly Ext: No edema, warm Neuro: Alert and oriented, No gross deficits Results for orders placed or performed in visit on 04/05/22  Urine Culture   Specimen: Urine   UR  Result Value Ref Range   Urine Culture, Routine Final report    Organism ID, Bacteria Comment   Microscopic Examination   Urine  Result Value Ref Range   WBC, UA 0-5 0 - 5 /hpf   RBC, Urine 0-2 0 - 2 /hpf   Epithelial Cells (non renal) 0-10 0 - 10 /hpf   Renal Epithel, UA None seen None seen /hpf   Bacteria, UA Few (A) None seen/Few  Urinalysis, Complete  Result Value Ref Range   Specific Gravity, UA >1.030 (H) 1.005 - 1.030   pH, UA 5.5 5.0 - 7.5   Color, UA Yellow Yellow   Appearance Ur Clear Clear   Leukocytes,UA Negative Negative   Protein,UA Negative Negative/Trace   Glucose, UA Negative Negative   Ketones, UA Negative Negative   RBC, UA Trace (A) Negative   Bilirubin, UA Negative Negative   Urobilinogen, Ur 0.2 0.2 - 1.0 mg/dL   Nitrite, UA Negative Negative   Microscopic Examination See below:     Assessment and plan:  1. Dysuria     No orders of the defined types were placed in this encounter.   Orders Placed This Encounter  Procedures   Urine Culture   Microscopic Examination   Urinalysis, Complete   Pt. Reassured. No sign of infection. Follow up as needed.  Claretta Fraise, MD

## 2022-04-07 LAB — URINE CULTURE

## 2022-06-22 ENCOUNTER — Other Ambulatory Visit: Payer: Self-pay | Admitting: Family Medicine

## 2022-06-22 DIAGNOSIS — I10 Essential (primary) hypertension: Secondary | ICD-10-CM

## 2022-06-22 DIAGNOSIS — E7800 Pure hypercholesterolemia, unspecified: Secondary | ICD-10-CM

## 2022-06-22 DIAGNOSIS — Z8673 Personal history of transient ischemic attack (TIA), and cerebral infarction without residual deficits: Secondary | ICD-10-CM

## 2022-07-01 ENCOUNTER — Ambulatory Visit (INDEPENDENT_AMBULATORY_CARE_PROVIDER_SITE_OTHER): Payer: Medicare Other | Admitting: Family Medicine

## 2022-07-01 ENCOUNTER — Encounter: Payer: Self-pay | Admitting: Family Medicine

## 2022-07-01 VITALS — BP 116/63 | HR 74 | Temp 97.6°F | Ht 62.0 in | Wt 162.2 lb

## 2022-07-01 DIAGNOSIS — Z0000 Encounter for general adult medical examination without abnormal findings: Secondary | ICD-10-CM

## 2022-07-01 DIAGNOSIS — I10 Essential (primary) hypertension: Secondary | ICD-10-CM

## 2022-07-01 DIAGNOSIS — K219 Gastro-esophageal reflux disease without esophagitis: Secondary | ICD-10-CM | POA: Diagnosis not present

## 2022-07-01 DIAGNOSIS — E7800 Pure hypercholesterolemia, unspecified: Secondary | ICD-10-CM | POA: Diagnosis not present

## 2022-07-01 DIAGNOSIS — Z0001 Encounter for general adult medical examination with abnormal findings: Secondary | ICD-10-CM | POA: Diagnosis not present

## 2022-07-01 DIAGNOSIS — Z8719 Personal history of other diseases of the digestive system: Secondary | ICD-10-CM | POA: Diagnosis not present

## 2022-07-01 DIAGNOSIS — E559 Vitamin D deficiency, unspecified: Secondary | ICD-10-CM | POA: Diagnosis not present

## 2022-07-01 DIAGNOSIS — Z8673 Personal history of transient ischemic attack (TIA), and cerebral infarction without residual deficits: Secondary | ICD-10-CM

## 2022-07-01 NOTE — Progress Notes (Signed)
Complete physical exam  Patient: Brittany Osborn   DOB: [birth year]   77 y.o. Female  MRN: [id number]  Subjective:    Chief Complaint  Patient presents with   Annual Exam    Brittany Osborn is a 77 y.o. female who presents today for a complete physical exam. She reports consuming a general diet. Home exercise routine includes stretching daily. She generally feels well. She reports sleeping fairly well. She does not have additional problems to discuss today.   HTN Complaint with meds - Yes Current Medications - lisinopril 10 mg Pertinent ROS:  Headache - No Fatigue - No Visual Disturbances - No Chest pain - No Dyspnea - No Palpitations - No LE edema - mild baseline  2. HLD On lipitor.   3. GERD Compliant with medications - Yes Current medications - pepid prn. Cough - No Sore throat - No Voice change - No Hemoptysis - No Dysphagia or dyspepsia - occasionally, her baseline Water brash - No Red Flags (weight loss, hematochezia, melena, weight loss, early satiety, fevers, odynophagia, or persistent vomiting) - No  Had hiatial hernia repair and Nissen about 18 months ago.   4. Vitamin D On supplement.   Most recent fall risk assessment:    07/01/2022   10:41 AM  Fall Risk   Falls in the past year? 0     Most recent depression screenings:    07/01/2022   10:41 AM 04/05/2022    2:14 PM  PHQ 2/9 Scores  PHQ - 2 Score 0 0  PHQ- 9 Score 0     Vision:Within last year and Dental: No current dental problems and No regular dental care   Past Medical History:  Diagnosis Date   Anxiety    Arthritis    Cataracts, bilateral    Corrected with surgery   Complication of anesthesia    slow to awaken 6 yrs ago after ruptured hiatal hernia surgery   Family history of adverse reaction to anesthesia    women in family hard to awaken   GERD (gastroesophageal reflux disease)    H/O hiatal hernia    Headache(784.0)    Hyperlipidemia    Hypertension    Stroke ([place]) 09/2018 and  old cva not sure when   09-2018 took tpa for, no residual defecits on plavix      Patient Care Team: Gwenlyn Perking, FNP as PCP - General (Family Medicine) Carol Ada, MD as Consulting Physician (Gastroenterology)   Outpatient Medications Prior to Visit  Medication Sig   acetaminophen (TYLENOL) 500 MG tablet Take 500 mg by mouth 2 (two) times daily as needed for moderate pain or headache.   aspirin EC 81 MG tablet Take 81 mg by mouth at bedtime.   atorvastatin (LIPITOR) 20 MG tablet TAKE 1 TABLET BY MOUTH EVERY DAY   Cholecalciferol (VITAMIN D3) 25 MCG (1000 UT) CAPS Take 1,000 Units by mouth daily.   docusate sodium (COLACE) 100 MG capsule Take 100 mg by mouth every other day. Alternates with senokot   famotidine (PEPCID) 10 MG tablet Take 5 mg by mouth 2 (two) times daily as needed for heartburn or indigestion.   lisinopril (ZESTRIL) 10 MG tablet TAKE 1 TABLET BY MOUTH EVERY DAY   melatonin 5 MG TABS Take 5 mg by mouth 2 (two) times daily as needed (anxiety/jitters).   senna (SENOKOT) 8.6 MG tablet Take 1 tablet by mouth every other day. Alternates with docusate   [DISCONTINUED] doxycycline (VIBRAMYCIN) 100 MG capsule  Take 1 capsule (100 mg total) by mouth 2 (two) times daily.   No facility-administered medications prior to visit.    ROS Negative unless specially indicated above in HPI.      Objective:     BP 116/63   Pulse 74   Temp 97.6 F (36.4 C) (Temporal)   Ht '5\' 2"'$  (1.575 m)   Wt 162 lb 4 oz (73.6 kg)   SpO2 93%   BMI 29.68 kg/m    Physical Exam Vitals and nursing note reviewed.  Constitutional:      General: She is not in acute distress.    Appearance: Normal appearance. She is not ill-appearing.  HENT:     Head: Normocephalic.     Right Ear: Tympanic membrane, ear canal and external ear normal.     Left Ear: Tympanic membrane, ear canal and external ear normal.     Nose: Nose normal.     Mouth/Throat:     Mouth: Mucous membranes are dry.      Pharynx: Oropharynx is clear.  Eyes:     Extraocular Movements: Extraocular movements intact.     Conjunctiva/sclera: Conjunctivae normal.     Pupils: Pupils are equal, round, and reactive to light.  Neck:     Thyroid: No thyroid mass, thyromegaly or thyroid tenderness.     Vascular: No carotid bruit.  Cardiovascular:     Rate and Rhythm: Normal rate and regular rhythm.     Pulses: Normal pulses.     Heart sounds: Normal heart sounds. No murmur heard.    No friction rub. No gallop.  Pulmonary:     Effort: Pulmonary effort is normal.     Breath sounds: Normal breath sounds.  Abdominal:     General: Bowel sounds are normal. There is no distension.     Palpations: Abdomen is soft. There is no mass.     Tenderness: There is no abdominal tenderness. There is no guarding.  Musculoskeletal:        General: No tenderness.     Cervical back: Normal range of motion and neck supple. No tenderness.     Right lower leg: No edema.     Left lower leg: No edema.  Skin:    General: Skin is warm and dry.     Capillary Refill: Capillary refill takes less than 2 seconds.     Findings: No lesion or rash.  Neurological:     General: No focal deficit present.     Mental Status: She is alert and oriented to person, place, and time.     Cranial Nerves: No cranial nerve deficit.     Motor: No weakness.     Gait: Gait normal.  Psychiatric:        Mood and Affect: Mood normal.        Behavior: Behavior normal.        Thought Content: Thought content normal.        Judgment: Judgment normal.      No results found for any visits on 07/01/22.     Assessment & Plan:    Routine Health Maintenance and Physical Exam Brittany Osborn was seen today for annual exam.  Diagnoses and all orders for this visit:  Routine general medical examination at a health care facility Reviewed labs from 12/30/21.  Primary hypertension Well controlled on current regimen. Continue lisinopril.   Pure  hypercholesterolemia Last LDL was 54. Well controlled with atorvastatin.   Vitamin D deficiency On supplement. Well controlled.  History of stroke Continue statin.   Gastroesophageal reflux disease without esophagitis Well controlled on current regimen.   History of hiatal hernia Repaired in 2022.  Immunization History  Administered Date(s) Administered   Influenza, High Dose Seasonal PF 03/15/2016, 03/09/2017, 02/15/2018, 03/02/2019   Pneumococcal Polysaccharide-23 11/14/2012    Health Maintenance  Topic Date Due   Medicare Annual Wellness (AWV)  Never done   DTaP/Tdap/Td (1 - Tdap) Never done   COLONOSCOPY (Pts 45-72yr Insurance coverage will need to be confirmed)  07/03/2022 (Originally 09/15/2021)   INFLUENZA VACCINE  08/28/2022 (Originally 12/28/2021)   MAMMOGRAM  01/01/2023 (Originally 02/27/2009)   Pneumonia Vaccine 75 Years old (2 - PCV) 07/02/2023 (Originally 11/14/2013)   COVID-19 Vaccine (1) 07/18/2023 (Originally 04/23/1946)   Zoster Vaccines- Shingrix (1 of 2) 09/29/2023 (Originally 10/21/1997)   DEXA SCAN  Completed   Hepatitis C Screening  Completed   HPV VACCINES  Aged Out    Discussed health benefits of physical activity, and encouraged her to engage in regular exercise appropriate for her age and condition.  Problem List Items Addressed This Visit       Cardiovascular and Mediastinum   Primary hypertension     Digestive   Gastroesophageal reflux disease without esophagitis     Other   History of hiatal hernia, repaired 11/12/2012   History of stroke   Pure hypercholesterolemia   Vitamin D deficiency   Other Visit Diagnoses     Routine general medical examination at a health care facility    -  Primary      Return in about 6 months (around 12/30/2022) for Chronic follow up with labs.   The patient indicates understanding of these issues and agrees with the plan.  TGwenlyn Perking FNP

## 2022-07-01 NOTE — Patient Instructions (Signed)

## 2022-07-15 ENCOUNTER — Telehealth: Payer: Self-pay | Admitting: Family Medicine

## 2022-07-15 NOTE — Telephone Encounter (Signed)
Patient forgot to mention at her recent visit that she needs a handicap form filled out. Please call pt when its completed and she will pick it up.

## 2022-07-15 NOTE — Telephone Encounter (Signed)
Form typed and placed on PCP's desk

## 2022-07-19 NOTE — Telephone Encounter (Signed)
LMOVM hanidcap form at front desk ready for pickup

## 2022-07-21 ENCOUNTER — Encounter: Payer: Self-pay | Admitting: Family Medicine

## 2022-09-18 ENCOUNTER — Other Ambulatory Visit: Payer: Self-pay | Admitting: Family Medicine

## 2022-09-18 DIAGNOSIS — I10 Essential (primary) hypertension: Secondary | ICD-10-CM

## 2022-09-18 DIAGNOSIS — Z8673 Personal history of transient ischemic attack (TIA), and cerebral infarction without residual deficits: Secondary | ICD-10-CM

## 2022-09-18 DIAGNOSIS — E7800 Pure hypercholesterolemia, unspecified: Secondary | ICD-10-CM

## 2022-09-21 DIAGNOSIS — H5203 Hypermetropia, bilateral: Secondary | ICD-10-CM | POA: Diagnosis not present

## 2022-09-21 DIAGNOSIS — Z961 Presence of intraocular lens: Secondary | ICD-10-CM | POA: Diagnosis not present

## 2022-12-16 ENCOUNTER — Other Ambulatory Visit: Payer: Self-pay | Admitting: Family Medicine

## 2022-12-16 DIAGNOSIS — E7800 Pure hypercholesterolemia, unspecified: Secondary | ICD-10-CM

## 2022-12-16 DIAGNOSIS — Z8673 Personal history of transient ischemic attack (TIA), and cerebral infarction without residual deficits: Secondary | ICD-10-CM

## 2022-12-16 DIAGNOSIS — I10 Essential (primary) hypertension: Secondary | ICD-10-CM

## 2022-12-30 ENCOUNTER — Encounter: Payer: Self-pay | Admitting: Family Medicine

## 2022-12-30 ENCOUNTER — Ambulatory Visit: Payer: Medicare Other | Admitting: Family Medicine

## 2022-12-30 VITALS — BP 122/63 | HR 67 | Temp 98.0°F | Ht 62.0 in | Wt 163.1 lb

## 2022-12-30 DIAGNOSIS — Z8673 Personal history of transient ischemic attack (TIA), and cerebral infarction without residual deficits: Secondary | ICD-10-CM | POA: Diagnosis not present

## 2022-12-30 DIAGNOSIS — N289 Disorder of kidney and ureter, unspecified: Secondary | ICD-10-CM | POA: Diagnosis not present

## 2022-12-30 DIAGNOSIS — E559 Vitamin D deficiency, unspecified: Secondary | ICD-10-CM

## 2022-12-30 DIAGNOSIS — K219 Gastro-esophageal reflux disease without esophagitis: Secondary | ICD-10-CM

## 2022-12-30 DIAGNOSIS — E7800 Pure hypercholesterolemia, unspecified: Secondary | ICD-10-CM | POA: Diagnosis not present

## 2022-12-30 DIAGNOSIS — I10 Essential (primary) hypertension: Secondary | ICD-10-CM | POA: Diagnosis not present

## 2022-12-30 LAB — CMP14+EGFR
ALT: 16 IU/L (ref 0–32)
AST: 17 IU/L (ref 0–40)
Albumin: 4.2 g/dL (ref 3.8–4.8)
Alkaline Phosphatase: 73 IU/L (ref 44–121)
BUN/Creatinine Ratio: 17 (ref 12–28)
BUN: 18 mg/dL (ref 8–27)
Bilirubin Total: 0.7 mg/dL (ref 0.0–1.2)
CO2: 21 mmol/L (ref 20–29)
Calcium: 9.1 mg/dL (ref 8.7–10.3)
Chloride: 105 mmol/L (ref 96–106)
Creatinine, Ser: 1.04 mg/dL — ABNORMAL HIGH (ref 0.57–1.00)
Globulin, Total: 2.3 g/dL (ref 1.5–4.5)
Glucose: 99 mg/dL (ref 70–99)
Potassium: 4.3 mmol/L (ref 3.5–5.2)
Sodium: 139 mmol/L (ref 134–144)
Total Protein: 6.5 g/dL (ref 6.0–8.5)
eGFR: 56 mL/min/{1.73_m2} — ABNORMAL LOW (ref >59)

## 2022-12-30 LAB — CBC WITH DIFFERENTIAL/PLATELET
Basophils Absolute: 0.1 10*3/uL (ref 0.0–0.2)
Basos: 1 %
EOS (ABSOLUTE): 0.1 10*3/uL (ref 0.0–0.4)
Eos: 3 %
Hematocrit: 39.5 % (ref 34.0–46.6)
Hemoglobin: 13.3 g/dL (ref 11.1–15.9)
Immature Grans (Abs): 0 10*3/uL (ref 0.0–0.1)
Immature Granulocytes: 0 %
Lymphocytes Absolute: 1.7 10*3/uL (ref 0.7–3.1)
Lymphs: 34 %
MCH: 32.5 pg (ref 26.6–33.0)
MCHC: 33.7 g/dL (ref 31.5–35.7)
MCV: 97 fL (ref 79–97)
Monocytes Absolute: 0.5 10*3/uL (ref 0.1–0.9)
Monocytes: 11 %
Neutrophils Absolute: 2.5 10*3/uL (ref 1.4–7.0)
Neutrophils: 51 %
Platelets: 218 10*3/uL (ref 150–450)
RBC: 4.09 x10E6/uL (ref 3.77–5.28)
RDW: 12.5 % (ref 11.7–15.4)
WBC: 4.8 10*3/uL (ref 3.4–10.8)

## 2022-12-30 LAB — TSH

## 2022-12-30 LAB — LIPID PANEL
Chol/HDL Ratio: 2.5 ratio (ref 0.0–4.4)
Cholesterol, Total: 140 mg/dL (ref 100–199)
HDL: 55 mg/dL (ref >39)
LDL Chol Calc (NIH): 69 mg/dL (ref 0–99)
Triglycerides: 86 mg/dL (ref 0–149)
VLDL Cholesterol Cal: 16 mg/dL (ref 5–40)

## 2022-12-30 LAB — VITAMIN D 25 HYDROXY (VIT D DEFICIENCY, FRACTURES)

## 2022-12-30 NOTE — Progress Notes (Signed)
Established Patient Office Visit  Subjective   Patient ID: Brittany Osborn, female    DOB: [birth year]  Age: 77 y.o. MRN: [id number]  Chief Complaint  Patient presents with   Medical Management of Chronic Issues   Hypertension   Here with grandson today.   HPI HTN Complaint with meds - Yes Current Medications - lisinopril 10 mg Pertinent ROS:  Headache - No Fatigue - No Visual Disturbances - No Chest pain - No Dyspnea - No Palpitations - No LE edema - mild baseline  2. HLD On lipitor. Fair diet. No exercise.   3. GERD Compliant with medications - Yes Current medications - pepid prn. Cough - No Sore throat - No Voice change - No Hemoptysis - No Dysphagia or dyspepsia - rarely, her baseline Water brash - No Red Flags (weight loss, hematochezia, melena, weight loss, early satiety, fevers, odynophagia, or persistent vomiting) - No  Had hiatial hernia repair and Nissen last year  4. Vitamin D On supplement.      ROS As per HPI.    Objective:     BP 122/63   Pulse 67   Temp 98 F (36.7 C) (Temporal)   Ht 5\' 2"  (1.575 m)   Wt 163 lb 2 oz (74 kg)   SpO2 96%   BMI 29.84 kg/m    Physical Exam Vitals and nursing note reviewed.  Constitutional:      General: She is not in acute distress.    Appearance: She is not ill-appearing, toxic-appearing or diaphoretic.  HENT:     Mouth/Throat:     Mouth: Mucous membranes are moist.     Pharynx: Oropharynx is clear.  Cardiovascular:     Rate and Rhythm: Normal rate and regular rhythm.     Heart sounds: Normal heart sounds. No murmur heard. Pulmonary:     Effort: Pulmonary effort is normal. No respiratory distress.     Breath sounds: Normal breath sounds. No wheezing or rhonchi.  Abdominal:     General: Bowel sounds are normal. There is no distension.     Palpations: Abdomen is soft.     Tenderness: There is no abdominal tenderness. There is no guarding or rebound.  Musculoskeletal:     Cervical back: Neck  supple. No rigidity.     Right lower leg: No edema.     Left lower leg: No edema.  Skin:    General: Skin is warm and dry.  Neurological:     General: No focal deficit present.     Mental Status: She is alert and oriented to person, place, and time.  Psychiatric:        Mood and Affect: Mood normal.        Behavior: Behavior normal.      No results found for any visits on 12/30/22.    The ASCVD Risk score (Arnett DK, et al., 2019) failed to calculate for the following reasons:   The patient has a prior MI or stroke diagnosis    Assessment & Plan:   Willodean was seen today for medical management of chronic issues and hypertension.  Diagnoses and all orders for this visit:  Primary hypertension Well controlled on current regimen.  -     CBC with Differential/Platelet -     CMP14+EGFR -     TSH  Pure hypercholesterolemia Fasting panel pending.  -     Lipid panel  Vitamin D deficiency On supplement. Labs pending.  -     VITAMIN  D 25 Hydroxy (Vit-D Deficiency, Fractures)  Gastroesophageal reflux disease without esophagitis Well controlled on current regimen.   History of stroke On statin and aspirin.    Return in about 6 months (around 07/02/2023) for CPE.   The patient indicates understanding of these issues and agrees with the plan.   Gabriel Earing, FNP

## 2023-01-02 NOTE — Addendum Note (Signed)
Addended by: Angela Adam on: 01/02/2023 09:56 AM   Modules accepted: Orders

## 2023-01-05 ENCOUNTER — Other Ambulatory Visit: Payer: Self-pay

## 2023-01-05 DIAGNOSIS — Z780 Asymptomatic menopausal state: Secondary | ICD-10-CM

## 2023-01-09 ENCOUNTER — Other Ambulatory Visit: Payer: Medicare Other

## 2023-01-09 DIAGNOSIS — N289 Disorder of kidney and ureter, unspecified: Secondary | ICD-10-CM | POA: Diagnosis not present

## 2023-01-09 LAB — BMP8+EGFR
BUN/Creatinine Ratio: 12 (ref 12–28)
BUN: 11 mg/dL (ref 8–27)
CO2: 23 mmol/L (ref 20–29)
Calcium: 9.1 mg/dL (ref 8.7–10.3)
Chloride: 106 mmol/L (ref 96–106)
Creatinine, Ser: 0.89 mg/dL (ref 0.57–1.00)
Glucose: 96 mg/dL (ref 70–99)
Potassium: 5.1 mmol/L (ref 3.5–5.2)
Sodium: 142 mmol/L (ref 134–144)
eGFR: 68 mL/min/{1.73_m2} (ref >59)

## 2023-06-17 ENCOUNTER — Other Ambulatory Visit: Payer: Self-pay | Admitting: Family Medicine

## 2023-06-17 DIAGNOSIS — I10 Essential (primary) hypertension: Secondary | ICD-10-CM

## 2023-06-17 DIAGNOSIS — E7800 Pure hypercholesterolemia, unspecified: Secondary | ICD-10-CM

## 2023-06-17 DIAGNOSIS — Z8673 Personal history of transient ischemic attack (TIA), and cerebral infarction without residual deficits: Secondary | ICD-10-CM

## 2023-06-28 ENCOUNTER — Telehealth: Payer: Self-pay | Admitting: Family Medicine

## 2023-06-28 NOTE — Telephone Encounter (Signed)
She will need fasting labs for her CPE. She can come fasting for her appt or she can come in early for labs but will need a lab appt if she comes early.

## 2023-06-28 NOTE — Telephone Encounter (Signed)
Copied from CRM [id number]. Topic: General - Other >> Jun 28, 2023 10:43 AM Thomes Dinning wrote: Reason for CRM: Patient would like to know if she should be fasting before her lab appointment

## 2023-06-28 NOTE — Telephone Encounter (Signed)
No labs ordered

## 2023-06-29 ENCOUNTER — Other Ambulatory Visit: Payer: Self-pay

## 2023-06-29 DIAGNOSIS — I10 Essential (primary) hypertension: Secondary | ICD-10-CM

## 2023-06-29 DIAGNOSIS — E7800 Pure hypercholesterolemia, unspecified: Secondary | ICD-10-CM

## 2023-06-29 DIAGNOSIS — E559 Vitamin D deficiency, unspecified: Secondary | ICD-10-CM

## 2023-06-29 NOTE — Telephone Encounter (Signed)
R/c NVM. Please order future labs for pt

## 2023-07-05 ENCOUNTER — Other Ambulatory Visit: Payer: Medicare Other

## 2023-07-05 ENCOUNTER — Encounter: Payer: Medicare Other | Admitting: Family Medicine

## 2023-09-17 ENCOUNTER — Other Ambulatory Visit: Payer: Self-pay | Admitting: Family Medicine

## 2023-09-17 DIAGNOSIS — Z8673 Personal history of transient ischemic attack (TIA), and cerebral infarction without residual deficits: Secondary | ICD-10-CM

## 2023-09-17 DIAGNOSIS — E7800 Pure hypercholesterolemia, unspecified: Secondary | ICD-10-CM

## 2023-09-17 DIAGNOSIS — I10 Essential (primary) hypertension: Secondary | ICD-10-CM

## 2023-09-22 DIAGNOSIS — Z961 Presence of intraocular lens: Secondary | ICD-10-CM | POA: Diagnosis not present

## 2023-09-22 DIAGNOSIS — H5213 Myopia, bilateral: Secondary | ICD-10-CM | POA: Diagnosis not present

## 2023-09-22 DIAGNOSIS — H524 Presbyopia: Secondary | ICD-10-CM | POA: Diagnosis not present

## 2023-09-28 ENCOUNTER — Ambulatory Visit (INDEPENDENT_AMBULATORY_CARE_PROVIDER_SITE_OTHER): Payer: Medicare Other | Admitting: Family Medicine

## 2023-09-28 ENCOUNTER — Ambulatory Visit (INDEPENDENT_AMBULATORY_CARE_PROVIDER_SITE_OTHER)

## 2023-09-28 VITALS — BP 110/63 | HR 76 | Temp 98.4°F | Ht 62.0 in | Wt 161.8 lb

## 2023-09-28 DIAGNOSIS — M810 Age-related osteoporosis without current pathological fracture: Secondary | ICD-10-CM | POA: Diagnosis not present

## 2023-09-28 DIAGNOSIS — Z0001 Encounter for general adult medical examination with abnormal findings: Secondary | ICD-10-CM | POA: Diagnosis not present

## 2023-09-28 DIAGNOSIS — Z0000 Encounter for general adult medical examination without abnormal findings: Secondary | ICD-10-CM

## 2023-09-28 DIAGNOSIS — E7800 Pure hypercholesterolemia, unspecified: Secondary | ICD-10-CM

## 2023-09-28 DIAGNOSIS — K219 Gastro-esophageal reflux disease without esophagitis: Secondary | ICD-10-CM | POA: Diagnosis not present

## 2023-09-28 DIAGNOSIS — I10 Essential (primary) hypertension: Secondary | ICD-10-CM | POA: Diagnosis not present

## 2023-09-28 DIAGNOSIS — J301 Allergic rhinitis due to pollen: Secondary | ICD-10-CM | POA: Diagnosis not present

## 2023-09-28 DIAGNOSIS — Z8673 Personal history of transient ischemic attack (TIA), and cerebral infarction without residual deficits: Secondary | ICD-10-CM | POA: Diagnosis not present

## 2023-09-28 DIAGNOSIS — Z780 Asymptomatic menopausal state: Secondary | ICD-10-CM | POA: Diagnosis not present

## 2023-09-28 DIAGNOSIS — E559 Vitamin D deficiency, unspecified: Secondary | ICD-10-CM

## 2023-09-28 MED ORDER — LEVOCETIRIZINE DIHYDROCHLORIDE 5 MG PO TABS: 5.0000 mg | ORAL_TABLET | Freq: Every evening | ORAL | 3 refills | Status: DC

## 2023-09-28 NOTE — Patient Instructions (Signed)

## 2023-09-28 NOTE — Progress Notes (Unsigned)
 Complete physical exam  Patient: Brittany Osborn   DOB: Aug 12, 1945   78 y.o. Female  MRN: [id number]  Subjective:    Chief Complaint  Patient presents with   Annual Exam   Here with son.   Brittany Osborn is a 78 y.o. female who presents today for a complete physical exam. She reports consuming a general diet. Home exercise routine includes PT exercises for balance. She generally feels fairly well. She reports sleeping fairly well. She does have additional problems to discuss today.   Postnasal drip for last 4-8 weeks, intermittent. Dry cough. Intermittent sinus pressure. Watery eyes. Taking cloricidin without relief. Worse after polleen exposure.   Most recent fall risk assessment:    09/28/2023    3:06 PM  Fall Risk   Falls in the past year? 0     Most recent depression screenings:    09/28/2023    3:07 PM 12/30/2022   11:06 AM  PHQ 2/9 Scores  PHQ - 2 Score 0 0  PHQ- 9 Score 0 0      Patient Care Team: Albertha Huger, FNP as PCP - General (Family Medicine) Alvis Jourdain, MD as Consulting Physician (Gastroenterology)   Outpatient Medications Prior to Visit  Medication Sig   acetaminophen  (TYLENOL ) 500 MG tablet Take 500 mg by mouth 2 (two) times daily as needed for moderate pain or headache.   aspirin  EC 81 MG tablet Take 81 mg by mouth at bedtime.   atorvastatin  (LIPITOR) 20 MG tablet TAKE 1 TABLET BY MOUTH EVERY DAY   Cholecalciferol (VITAMIN D3) 25 MCG (1000 UT) CAPS Take 1,000 Units by mouth daily.   docusate sodium  (COLACE) 100 MG capsule Take 100 mg by mouth every other day. Alternates with senokot   famotidine (PEPCID) 10 MG tablet Take 5 mg by mouth 2 (two) times daily as needed for heartburn or indigestion.   lisinopril  (ZESTRIL ) 10 MG tablet TAKE 1 TABLET BY MOUTH EVERY DAY   melatonin 5 MG TABS Take 5 mg by mouth 2 (two) times daily as needed (anxiety/jitters).   senna (SENOKOT) 8.6 MG tablet Take 1 tablet by mouth every other day. Alternates with docusate    No facility-administered medications prior to visit.    ROS Negative unless specially indicated above in HPI.       Objective:     BP 110/63   Pulse 76   Temp 98.4 F (36.9 C) (Temporal)   Ht 5\' 2"  (1.575 m)   Wt 161 lb 12.8 oz (73.4 kg)   SpO2 94%   BMI 29.59 kg/m  Wt Readings from Last 3 Encounters:  09/28/23 161 lb 12.8 oz (73.4 kg)  12/30/22 163 lb 2 oz (74 kg)  07/01/22 162 lb 4 oz (73.6 kg)      Physical Exam Vitals and nursing note reviewed.  Constitutional:      General: She is not in acute distress.    Appearance: Normal appearance. She is not ill-appearing.  HENT:     Head: Normocephalic.     Right Ear: Tympanic membrane, ear canal and external ear normal.     Left Ear: Tympanic membrane, ear canal and external ear normal.     Nose: Nose normal.     Mouth/Throat:     Mouth: Mucous membranes are moist.     Pharynx: Oropharynx is clear.  Eyes:     General:        Right eye: Discharge (watery) present.  Left eye: Discharge (watery) present.    Extraocular Movements: Extraocular movements intact.     Conjunctiva/sclera: Conjunctivae normal.     Pupils: Pupils are equal, round, and reactive to light.  Cardiovascular:     Rate and Rhythm: Normal rate and regular rhythm.     Pulses: Normal pulses.     Heart sounds: Normal heart sounds. No murmur heard.    No friction rub. No gallop.  Pulmonary:     Effort: Pulmonary effort is normal.     Breath sounds: Normal breath sounds.  Abdominal:     General: Bowel sounds are normal. There is no distension.     Palpations: Abdomen is soft. There is no mass.     Tenderness: There is no abdominal tenderness. There is no guarding.  Musculoskeletal:     Cervical back: Normal range of motion and neck supple. No tenderness.     Right lower leg: No edema.     Left lower leg: No edema.  Skin:    General: Skin is warm and dry.     Capillary Refill: Capillary refill takes less than 2 seconds.     Findings:  No lesion or rash.  Neurological:     General: No focal deficit present.     Mental Status: She is alert and oriented to person, place, and time.     Cranial Nerves: No cranial nerve deficit.     Motor: No weakness.     Gait: Gait normal.  Psychiatric:        Mood and Affect: Mood normal.        Behavior: Behavior normal.        Thought Content: Thought content normal.        Judgment: Judgment normal.      No results found for any visits on 09/28/23.     Assessment & Plan:    Routine Health Maintenance and Physical Exam  Brittany Osborn was seen today for annual exam.  Diagnoses and all orders for this visit:  Routine general medical examination at a health care facility  Seasonal allergic rhinitis due to pollen Start xyzal .  -     levocetirizine (XYZAL ) 5 MG tablet; Take 1 tablet (5 mg total) by mouth every evening.  History of stroke On statin and aspirin .   Gastroesophageal reflux disease without esophagitis Well controlled on current regimen.   Primary hypertension Well controlled on current regimen.  -     CBC with Differential/Platelet; Future -     CMP14+EGFR; Future -     Lipid panel; Future -     TSH; Future  Pure hypercholesterolemia Will return for fasting labs. On statin.  -     Lipid panel; Future  Vitamin D  deficiency -     VITAMIN D  25 Hydroxy (Vit-D Deficiency, Fractures); Future    Immunization History  Administered Date(s) Administered   Influenza, High Dose Seasonal PF 03/15/2016, 03/09/2017, 02/15/2018, 03/02/2019   Pneumococcal Polysaccharide-23 11/14/2012    Health Maintenance  Topic Date Due   Medicare Annual Wellness (AWV)  Never done   Zoster Vaccines- Shingrix (1 of 2) Never done   Colonoscopy  12/30/2023 (Originally 09/15/2021)   DTaP/Tdap/Td (1 - Tdap) 09/27/2024 (Originally 10/22/1966)   Pneumonia Vaccine 6+ Years old (2 of 2 - PCV) 09/27/2024 (Originally 11/14/2013)   COVID-19 Vaccine (1 - 2024-25 season) 10/13/2024 (Originally  01/29/2023)   INFLUENZA VACCINE  12/29/2023   DEXA SCAN  09/27/2025   Hepatitis C Screening  Completed  HPV VACCINES  Aged Out   Meningococcal B Vaccine  Aged Out    Discussed health benefits of physical activity, and encouraged her to engage in regular exercise appropriate for her age and condition.  Problem List Items Addressed This Visit       Cardiovascular and Mediastinum   Primary hypertension   Relevant Orders   CBC with Differential/Platelet   CMP14+EGFR   Lipid panel   TSH     Digestive   Gastroesophageal reflux disease without esophagitis     Other   History of stroke   Pure hypercholesterolemia   Relevant Orders   Lipid panel   Vitamin D  deficiency   Relevant Orders   VITAMIN D  25 Hydroxy (Vit-D Deficiency, Fractures)   Other Visit Diagnoses       Routine general medical examination at a health care facility    -  Primary     Seasonal allergic rhinitis due to pollen       Relevant Medications   levocetirizine (XYZAL ) 5 MG tablet      Return in about 6 months (around 03/30/2024) for chronic follow up.   The patient indicates understanding of these issues and agrees with the plan.  Albertha Huger, FNP

## 2023-09-30 ENCOUNTER — Encounter: Payer: Self-pay | Admitting: Family Medicine

## 2023-10-03 ENCOUNTER — Telehealth: Payer: Self-pay

## 2023-10-03 NOTE — Telephone Encounter (Unsigned)
 Copied from CRM [id number]. Topic: Clinical - Lab/Test Results >> Oct 03, 2023  3:29 PM Carlatta H wrote: Reason for CRM: Please call patient back about bone density testing results

## 2023-10-04 ENCOUNTER — Ambulatory Visit: Payer: Self-pay

## 2023-10-04 ENCOUNTER — Other Ambulatory Visit: Payer: Self-pay | Admitting: *Deleted

## 2023-10-04 DIAGNOSIS — M810 Age-related osteoporosis without current pathological fracture: Secondary | ICD-10-CM

## 2023-10-04 NOTE — Telephone Encounter (Signed)
 Copied from CRM [id number]. Topic: Clinical - Lab/Test Results >> Oct 04, 2023  4:06 PM DeAngela L wrote: Reason for CRM: Patient calling about bone density test results   Patient calling for bone density results. Patient will be transferred to CAL by PAS for assistance.    Reason for Disposition  Caller requesting routine or non-urgent lab result  Answer Assessment - Initial Assessment Questions 1. REASON FOR CALL or QUESTION: "What is your reason for calling today?" or "How can I best help you?" or "What question do you have that I can help answer?"     Bone density results  2. CALLER: Document the source of call. (e.g., laboratory, patient).     Patient  Protocols used: PCP Call - No Triage-A-AH

## 2023-10-04 NOTE — Telephone Encounter (Signed)
 Please see results note on DEXA, pt aware.

## 2023-10-04 NOTE — Telephone Encounter (Signed)
 See result note.

## 2023-10-06 ENCOUNTER — Telehealth: Payer: Self-pay

## 2023-10-06 NOTE — Progress Notes (Signed)
 Care Guide Pharmacy Note  10/06/2023 Name: CHESTER FLORIANO MRN: [id number] DOB: 1945/08/07  Referred By: Albertha Huger, FNP Reason for referral: Complex Care Management (Outreach to schedule with Pharm d )   CIERAH ZAREMSKI is a 78 y.o. year old female who is a primary care patient of Albertha Huger, FNP.  YALIXA GIUFFRE was referred to the pharmacist for assistance related to: osteoporosis   Successful contact was made with the patient to discuss pharmacy services including being ready for the pharmacist to call at least 5 minutes before the scheduled appointment time and to have medication bottles and any blood pressure readings ready for review. The patient agreed to meet with the pharmacist via telephone visit on (date/time).11/02/2023  Lenton Rail , RMA     [facility]  [facility], [facility] Guide  Direct Dial: [phone]  Website: [email or web address]

## 2023-11-02 ENCOUNTER — Other Ambulatory Visit

## 2023-11-02 NOTE — Progress Notes (Deleted)
   11/02/2023 Name: Brittany Osborn MRN: [id number] DOB: [birth year]  No chief complaint on file.   Brittany Osborn is a 78 y.o. year old female who presented for a telephone visit.   They were referred to the pharmacist by their PCP for assistance in managing osteoporosis .    Subjective:  Care Team: Primary Care Provider: Albertha Huger, FNP  {careteamprovider:27366}  Medication Access/Adherence  Current Pharmacy:  [id number] - [place], [place] - [address] [place] [address] Phone: [phone] Fax: [phone]   Patient reports affordability concerns with their medications: {YES/NO:21197} Patient reports access/transportation concerns to their pharmacy: {YES/NO:21197} Patient reports adherence concerns with their medications:  {YES/NO:21197} ***   Osteoporosis:  Current medications:  Medications tried in the past:   Current supplements:   Current physical activity: ***  Most recent DEXA: 09/28/23 Results in objective    Objective: CLINICAL DATA:  78 year old Female Postmenopausal. Screening for osteoporosis   TECHNIQUE: An axial (e.g., hips, spine) and/or appendicular (e.g., radius) exam was performed, as appropriate, using [employer] systems engineer at [facility]. Images are obtained for bone mineral density measurement and are not obtained for diagnostic purposes. [id number]   Exclusions: L3-L4 due to degenerative changes.   COMPARISON:  None. New baseline.   FINDINGS: Scan quality: Good.   LUMBAR SPINE (L1-L2):   BMD (in g/cm2): 0.847   T-score: -2.7   Z-score: -1.2   LEFT FEMORAL NECK:   BMD (in g/cm2): 0.658   T-score: -2.7   Z-score: -1.0   LEFT TOTAL HIP:   BMD (in g/cm2): 0.779   T-score: -1.8   Z-score: -0.2   RIGHT FEMORAL NECK:   BMD (in g/cm2): 0.679   T-score: -2.6   Z-score: -0.8   RIGHT TOTAL HIP:   BMD  (in g/cm2): 0.803   T-score: -1.6   Z-score: 0.0   RIGHT FOREARM (RADIUS 33%):   BMD (in g/cm2): 0.698   T-score: -2.1   Z-score: 0.2   FRAX 10-YEAR PROBABILITY OF FRACTURE:   FRAX not reported as the lowest BMD is not in the osteopenia range.   IMPRESSION: Osteoporosis based on BMD.   Fracture risk is increased.    Lab Results  Component Value Date   HGBA1C 5.6 09/21/2020    Lab Results  Component Value Date   CREATININE 0.89 01/09/2023   BUN 11 01/09/2023   NA 142 01/09/2023   K 5.1 01/09/2023   CL 106 01/09/2023   CO2 23 01/09/2023    Lab Results  Component Value Date   CHOL 140 12/30/2022   HDL 55 12/30/2022   LDLCALC 69 12/30/2022   TRIG 86 12/30/2022   CHOLHDL 2.5 12/30/2022    Medications Reviewed Today   Medications were not reviewed in this encounter       Assessment/Plan:   {Pharmacy A/P Choices:26421}  Follow Up Plan: ***  ***

## 2023-11-03 ENCOUNTER — Other Ambulatory Visit (INDEPENDENT_AMBULATORY_CARE_PROVIDER_SITE_OTHER): Admitting: Pharmacist

## 2023-11-03 DIAGNOSIS — M818 Other osteoporosis without current pathological fracture: Secondary | ICD-10-CM

## 2023-11-03 NOTE — Progress Notes (Signed)
 Brittany Osborn

## 2023-11-03 NOTE — Progress Notes (Signed)
 11/03/2023 Name: SUZIE VANDAM MRN: [id number] DOB: 02-02-1946  Chief Complaint  Patient presents with   Osteoporosis    KHALESSI BLOUGH is a 78 y.o. year old female who presented for a telephone visit. I connected with  Hassan Links on 11/03/23 by telephone and verified that I am speaking with the correct person using two identifiers. I discussed the limitations of evaluation and management by telemedicine. The patient expressed understanding and agreed to proceed.     They were referred to the pharmacist by their PCP for assistance in managing osteoporosis .   Subjective:  Care Team: Primary Care Provider: Albertha Huger, FNP   Medication Access/Adherence  Current Pharmacy:  [id number] [place], [place] - [address] [place] [address] Phone: [phone] Fax: [phone]   Patient reports affordability concerns with their medications: No  Patient reports access/transportation concerns to their pharmacy: No  Patient reports adherence concerns with their medications:  No    Osteoporosis:  Current medications: none Medications tried in the past: none  Current supplements: vitamin D3 1000 international units daily  Patient reports she has tried taking all kinds of calcium  supplements in the past and they all made her sick (tablets, chews, gummies etc.). She tries to make sure she gets calcium  in her diet and eats yogurt and drinks soy milk daily. Also eats cheese and green vegetables (green beans, spinach). Eats ~6 smaller meals per day due to reflux and makes sure she chews her food well to avoid difficulty swallowing. She denies a history of fractures or falls and says she feels steady on her feet.  Current physical activity: physical therapy exercises every day at home and tai chi occasionally  Most recent DEXA: 09/28/23 Objective:  FINDINGS: Scan quality: Good.   LUMBAR SPINE (L1-L2):   BMD (in  g/cm2): 0.847   T-score: -2.7   Z-score: -1.2   LEFT FEMORAL NECK:   BMD (in g/cm2): 0.658   T-score: -2.7   Z-score: -1.0   LEFT TOTAL HIP:   BMD (in g/cm2): 0.779   T-score: -1.8   Z-score: -0.2   RIGHT FEMORAL NECK:   BMD (in g/cm2): 0.679   T-score: -2.6   Z-score: -0.8   RIGHT TOTAL HIP:   BMD (in g/cm2): 0.803   T-score: -1.6   Z-score: 0.0   RIGHT FOREARM (RADIUS 33%):   BMD (in g/cm2): 0.698   T-score: -2.1   Z-score: 0.2   IMPRESSION: Osteoporosis based on BMD.   Fracture risk is increased.    Lab Results  Component Value Date   HGBA1C 5.6 09/21/2020    Lab Results  Component Value Date   CREATININE 0.89 01/09/2023   BUN 11 01/09/2023   NA 142 01/09/2023   K 5.1 01/09/2023   CL 106 01/09/2023   CO2 23 01/09/2023    Lab Results  Component Value Date   CHOL 140 12/30/2022   HDL 55 12/30/2022   LDLCALC 69 12/30/2022   TRIG 86 12/30/2022   CHOLHDL 2.5 12/30/2022    Medications Reviewed Today     Reviewed by Philmore Bream, RPH (Pharmacist) on 11/03/23 at 1629  Med List Status: <None>   Medication Order Taking? Sig Documenting Provider Last Dose Status Informant  acetaminophen  (TYLENOL ) 500 MG tablet [id number] No Take 500 mg by mouth 2 (two) times daily as needed for moderate pain or headache. [provider] Taking  Active Self  aspirin  EC 81 MG tablet [id number] No Take 81 mg by mouth at bedtime. [provider] Taking Active Self  atorvastatin  (LIPITOR) 20 MG tablet [id number] No TAKE 1 TABLET BY MOUTH EVERY DAY Albertha Huger, FNP Taking Active   Cholecalciferol (VITAMIN D3) 25 MCG (1000 UT) CAPS [id number] No Take 1,000 Units by mouth daily. [provider] Taking Active   docusate sodium  (COLACE) 100 MG capsule [id number] No Take 100 mg by mouth every other day. Alternates with senokot [provider] Taking Active Self           Med Note Val Garin, Alberteen Aloe   Fri Nov 13, 2020  1:40  PM)    famotidine (PEPCID) 10 MG tablet [id number] No Take 5 mg by mouth 2 (two) times daily as needed for heartburn or indigestion. [provider] Taking Active Self  levocetirizine (XYZAL ) 5 MG tablet [id number]  Take 1 tablet (5 mg total) by mouth every evening. Albertha Huger, FNP  Active   lisinopril  (ZESTRIL ) 10 MG tablet [id number] No TAKE 1 TABLET BY MOUTH EVERY DAY Albertha Huger, FNP Taking Active   melatonin 5 MG TABS [id number] No Take 5 mg by mouth 2 (two) times daily as needed (anxiety/jitters). [provider] Taking Active Self           Med Note Val Garin, Alberteen Aloe   Fri Nov 13, 2020  1:40 PM)    senna (SENOKOT) 8.6 MG tablet [id number] No Take 1 tablet by mouth every other day. Alternates with docusate [provider] Taking Active Self           Med Note Verdie Gladden   Fri Nov 13, 2020  1:40 PM)                Assessment/Plan:   Osteoporosis: - Currently inappropriately managed/opportunity for optimization. Patient is not a candidate for alendronate d/t history of GERD, Nissen procedure, and dysphagia/esophageal strictures. She would be a candidate for Reclast or Prolia. Reviewed medications with her and she was very concerned about side effects. She also said she would not be able to afford cost with Prolia (likely ~$300 per injection with Medicare). She prefers to focus on ensuring adequate calcium /vitamin D  intake and increasing weight bearing exercise. Discussed the importance of weight bearing exercise like walking and strength training. Encouraged her to continue and increase frequency of tai chi to help with balance. - Reviewed recommendation for daily calcium  intake of 1200 mg and vitamin D  intake of [phone] units - Reviewed benefits of weight bearing exercise - Encouraged her to reach out to us  if she changes her mind about medications   Follow Up Plan: PharmD as needed  Georga Killings, PharmD PGY-1 Pharmacy Resident

## 2024-01-01 ENCOUNTER — Other Ambulatory Visit

## 2024-01-01 DIAGNOSIS — I10 Essential (primary) hypertension: Secondary | ICD-10-CM | POA: Diagnosis not present

## 2024-01-01 DIAGNOSIS — E559 Vitamin D deficiency, unspecified: Secondary | ICD-10-CM

## 2024-01-01 DIAGNOSIS — E7800 Pure hypercholesterolemia, unspecified: Secondary | ICD-10-CM | POA: Diagnosis not present

## 2024-01-01 LAB — LIPID PANEL

## 2024-01-02 LAB — VITAMIN D 25 HYDROXY (VIT D DEFICIENCY, FRACTURES): Vit D, 25-Hydroxy: 33 ng/mL (ref 30.0–100.0)

## 2024-01-02 LAB — LIPID PANEL
Cholesterol, Total: 129 mg/dL (ref 100–199)
HDL: 60 mg/dL (ref >39)
LDL CALC COMMENT:: 2.2 ratio (ref 0.0–4.4)
LDL Chol Calc (NIH): 55 mg/dL (ref 0–99)
Triglycerides: 71 mg/dL (ref 0–149)
VLDL Cholesterol Cal: 14 mg/dL (ref 5–40)

## 2024-01-02 LAB — TSH: TSH: 3.26 u[IU]/mL (ref 0.450–4.500)

## 2024-01-02 LAB — CBC WITH DIFFERENTIAL/PLATELET
Basophils Absolute: 0 x10E3/uL (ref 0.0–0.2)
Basos: 0 %
EOS (ABSOLUTE): 0.1 x10E3/uL (ref 0.0–0.4)
Eos: 2 %
Hematocrit: 40.9 % (ref 34.0–46.6)
Hemoglobin: 13.2 g/dL (ref 11.1–15.9)
Immature Grans (Abs): 0 x10E3/uL (ref 0.0–0.1)
Immature Granulocytes: 0 %
Lymphocytes Absolute: 2 x10E3/uL (ref 0.7–3.1)
Lymphs: 46 %
MCH: 32.3 pg (ref 26.6–33.0)
MCHC: 32.3 g/dL (ref 31.5–35.7)
MCV: 100 fL — ABNORMAL HIGH (ref 79–97)
Monocytes Absolute: 0.4 x10E3/uL (ref 0.1–0.9)
Monocytes: 9 %
Neutrophils Absolute: 1.9 x10E3/uL (ref 1.4–7.0)
Neutrophils: 43 %
Platelets: 234 x10E3/uL (ref 150–450)
RBC: 4.09 x10E6/uL (ref 3.77–5.28)
RDW: 12 % (ref 11.7–15.4)
WBC: 4.5 x10E3/uL (ref 3.4–10.8)

## 2024-01-02 LAB — CMP14+EGFR
ALT: 17 IU/L (ref 0–32)
AST: 19 IU/L (ref 0–40)
Albumin: 4.2 g/dL (ref 3.8–4.8)
Alkaline Phosphatase: 66 IU/L (ref 44–121)
BUN/Creatinine Ratio: 14 (ref 12–28)
BUN: 13 mg/dL (ref 8–27)
Bilirubin Total: 0.9 mg/dL (ref 0.0–1.2)
CO2: 22 mmol/L (ref 20–29)
Calcium: 9.3 mg/dL (ref 8.7–10.3)
Chloride: 104 mmol/L (ref 96–106)
Creatinine, Ser: 0.96 mg/dL (ref 0.57–1.00)
Globulin, Total: 2.3 g/dL (ref 1.5–4.5)
Glucose: 98 mg/dL (ref 70–99)
Potassium: 4.6 mmol/L (ref 3.5–5.2)
Sodium: 140 mmol/L (ref 134–144)
Total Protein: 6.5 g/dL (ref 6.0–8.5)
eGFR: 61 mL/min/1.73 (ref >59)

## 2024-01-03 ENCOUNTER — Ambulatory Visit: Payer: Self-pay | Admitting: Family Medicine

## 2024-04-01 ENCOUNTER — Encounter: Payer: Self-pay | Admitting: Family Medicine

## 2024-04-01 ENCOUNTER — Ambulatory Visit: Admitting: Family Medicine

## 2024-04-01 VITALS — BP 120/68 | HR 69 | Temp 98.1°F | Ht 62.0 in | Wt 155.2 lb

## 2024-04-01 DIAGNOSIS — K219 Gastro-esophageal reflux disease without esophagitis: Secondary | ICD-10-CM | POA: Diagnosis not present

## 2024-04-01 DIAGNOSIS — E7800 Pure hypercholesterolemia, unspecified: Secondary | ICD-10-CM | POA: Diagnosis not present

## 2024-04-01 DIAGNOSIS — E559 Vitamin D deficiency, unspecified: Secondary | ICD-10-CM | POA: Diagnosis not present

## 2024-04-01 DIAGNOSIS — I10 Essential (primary) hypertension: Secondary | ICD-10-CM

## 2024-04-01 DIAGNOSIS — Z8673 Personal history of transient ischemic attack (TIA), and cerebral infarction without residual deficits: Secondary | ICD-10-CM

## 2024-04-01 MED ORDER — LISINOPRIL 10 MG PO TABS
10.0000 mg | ORAL_TABLET | Freq: Every day | ORAL | 3 refills | Status: AC
Start: 2024-04-01 — End: ?

## 2024-04-01 MED ORDER — ATORVASTATIN CALCIUM 20 MG PO TABS
20.0000 mg | ORAL_TABLET | Freq: Every day | ORAL | 3 refills | Status: AC
Start: 2024-04-01 — End: ?

## 2024-04-01 NOTE — Progress Notes (Signed)
 Established Patient Office Visit  Subjective   Patient ID: Brittany Osborn, female    DOB: May 14, 1946  Age: 78 y.o. MRN: [id number]  Chief Complaint  Patient presents with   Medical Management of Chronic Issues   Here with grandson today.   HPI HTN Complaint with meds - Yes Current Medications - lisinopril  10 mg Pertinent ROS:  Headache - No Fatigue - No Visual Disturbances - No Chest pain - No Dyspnea - Sometimes with strenuous activity Palpitations - No LE edema - mild baseline  2. HLD On lipitor. Fair diet. No exercise. She has been trying to lose some weight.   3. GERD Compliant with medications - Yes Current medications - pepid prn. Cough - No Sore throat - No Voice change - No Hemoptysis - No Dysphagia or dyspepsia - No Water brash - No Red Flags (weight loss, hematochezia, melena, weight loss, early satiety, fevers, odynophagia, or persistent vomiting) - No  Had hiatial hernia repair and Nissen last year  4. Vitamin D  On supplement.      ROS As per HPI.    Objective:     BP 120/68   Pulse 69   Temp 98.1 F (36.7 C) (Temporal)   Ht 5' 2 (1.575 m)   Wt 155 lb 3.2 oz (70.4 kg)   SpO2 94%   BMI 28.39 kg/m    Physical Exam Vitals and nursing note reviewed.  Constitutional:      General: She is not in acute distress.    Appearance: She is not ill-appearing, toxic-appearing or diaphoretic.  HENT:     Mouth/Throat:     Mouth: Mucous membranes are moist.     Pharynx: Oropharynx is clear.  Cardiovascular:     Rate and Rhythm: Normal rate and regular rhythm.     Heart sounds: Normal heart sounds. No murmur heard. Pulmonary:     Effort: Pulmonary effort is normal. No respiratory distress.     Breath sounds: Normal breath sounds. No wheezing or rhonchi.  Abdominal:     General: Bowel sounds are normal. There is no distension.     Palpations: Abdomen is soft.     Tenderness: There is no abdominal tenderness. There is no guarding or rebound.   Musculoskeletal:     Cervical back: Neck supple. No rigidity.     Right lower leg: No edema.     Left lower leg: No edema.  Skin:    General: Skin is warm and dry.  Neurological:     General: No focal deficit present.     Mental Status: She is alert and oriented to person, place, and time.  Psychiatric:        Mood and Affect: Mood normal.        Behavior: Behavior normal.      No results found for any visits on 04/01/24.    The ASCVD Risk score (Arnett DK, et al., 2019) failed to calculate for the following reasons:   Risk score cannot be calculated because patient has a medical history suggesting prior/existing ASCVD    Assessment & Plan:   Brittany Osborn was seen today for medical management of chronic issues and hypertension.  Diagnoses and all orders for this visit:  Primary hypertension Well controlled on current regimen.   Pure hypercholesterolemia Continue statin. Recent LDL at 55.  Vitamin D  deficiency Continue supplement.   Gastroesophageal reflux disease without esophagitis Well controlled on current regimen.   History of stroke On statin and aspirin .  Return in about 6 months (around 09/29/2024) for CPE.   The patient indicates understanding of these issues and agrees with the plan.   Brittany Osborn Brittany Search, FNP

## 2024-09-30 ENCOUNTER — Encounter: Payer: Self-pay | Admitting: Family Medicine

## 2024-10-11 ENCOUNTER — Encounter: Admitting: Family Medicine
